# Patient Record
Sex: Female | Born: 1950
Health system: Southern US, Community
[De-identification: ages and names within clinical notes are randomized; demographics above are authoritative.]

## PROBLEM LIST (undated history)

## (undated) DIAGNOSIS — F419 Anxiety disorder, unspecified: Secondary | ICD-10-CM

## (undated) DIAGNOSIS — F32A Depression, unspecified: Secondary | ICD-10-CM

## (undated) DIAGNOSIS — R42 Dizziness and giddiness: Secondary | ICD-10-CM

## (undated) DIAGNOSIS — F329 Major depressive disorder, single episode, unspecified: Secondary | ICD-10-CM

## (undated) DIAGNOSIS — N809 Endometriosis, unspecified: Secondary | ICD-10-CM

## (undated) DIAGNOSIS — K635 Polyp of colon: Secondary | ICD-10-CM

## (undated) HISTORY — PX: POLYPECTOMY: SHX149

## (undated) HISTORY — DX: Polyp of colon: K63.5

## (undated) HISTORY — DX: Anxiety disorder, unspecified: F41.9

## (undated) HISTORY — PX: ABLATION ON ENDOMETRIOSIS: SHX5787

## (undated) HISTORY — DX: Endometriosis, unspecified: N80.9

## (undated) HISTORY — DX: Depression, unspecified: F32.A

## (undated) HISTORY — DX: Major depressive disorder, single episode, unspecified: F32.9

---
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
## 2000-07-23 ENCOUNTER — Encounter (INDEPENDENT_AMBULATORY_CARE_PROVIDER_SITE_OTHER): Payer: Self-pay | Admitting: *Deleted

## 2000-07-23 ENCOUNTER — Encounter: Payer: Self-pay | Admitting: *Deleted

## 2000-07-23 ENCOUNTER — Encounter: Admission: RE | Admit: 2000-07-23 | Discharge: 2000-07-23 | Payer: Self-pay | Admitting: *Deleted

## 2000-07-23 ENCOUNTER — Other Ambulatory Visit: Admission: RE | Admit: 2000-07-23 | Discharge: 2000-07-23 | Payer: Self-pay | Admitting: *Deleted

## 2001-07-28 ENCOUNTER — Ambulatory Visit (HOSPITAL_COMMUNITY): Admission: RE | Admit: 2001-07-28 | Discharge: 2001-07-28 | Payer: Self-pay | Admitting: Gastroenterology

## 2001-07-28 ENCOUNTER — Encounter (INDEPENDENT_AMBULATORY_CARE_PROVIDER_SITE_OTHER): Payer: Self-pay | Admitting: *Deleted

## 2004-08-01 ENCOUNTER — Ambulatory Visit: Payer: Self-pay | Admitting: Internal Medicine

## 2004-08-29 ENCOUNTER — Ambulatory Visit: Payer: Self-pay | Admitting: Internal Medicine

## 2004-09-18 ENCOUNTER — Other Ambulatory Visit: Admission: RE | Admit: 2004-09-18 | Discharge: 2004-09-18 | Payer: Self-pay | Admitting: Family Medicine

## 2004-09-18 ENCOUNTER — Ambulatory Visit: Payer: Self-pay | Admitting: Family Medicine

## 2005-08-09 ENCOUNTER — Ambulatory Visit: Payer: Self-pay | Admitting: Internal Medicine

## 2006-06-26 ENCOUNTER — Ambulatory Visit: Payer: Self-pay | Admitting: Internal Medicine

## 2006-06-26 DIAGNOSIS — F411 Generalized anxiety disorder: Secondary | ICD-10-CM | POA: Insufficient documentation

## 2006-06-26 DIAGNOSIS — E781 Pure hyperglyceridemia: Secondary | ICD-10-CM | POA: Insufficient documentation

## 2006-09-17 ENCOUNTER — Telehealth (INDEPENDENT_AMBULATORY_CARE_PROVIDER_SITE_OTHER): Payer: Self-pay | Admitting: *Deleted

## 2010-02-12 ENCOUNTER — Encounter: Payer: Self-pay | Admitting: Family Medicine

## 2010-06-09 NOTE — Procedures (Signed)
[place]. [facility]  Patient:    Robin Tucker, Robin Tucker Visit Number: [id number] MRN: [id number]          Service Type: END Location: ENDO Attending Physician:  Nelda Marseille Dictated by:   Petra Kuba, M.D. Proc. Date: 07/28/01 Admit Date:  07/28/2001 Discharge Date: 07/28/2001   CC:         Vanita Panda   Procedure Report  PROCEDURE:  Colonoscopy with polypectomy.  INDICATION:  Family history of colon cancer, patient due for colonic screening.  Consent was signed after risks, benefits, methods, and options were thoroughly discussed in the office.  MEDICATIONS:  Demerol 70, Versed 7.5.  PROCEDURE:  Rectal inspection was pertinent for external hemorrhoids, small. Digital examination was negative.  The pediatric video adjustable colonoscope was inserted, easily advanced around the colon to the cecum.  This did require some abdominal pressure, no position changes.  The cecum was identified by the appendiceal orifice and the ileocecal valve.  The prep was adequate.  There was some liquid stool that required washing and suctioning.  On slow withdrawal through the colon, in the proximal transverse, a 3-4 mm sessile polyp was seen and hot biopsied x 2.  The scope was slowly withdrawn.  In the distal descending, a tiny polyp was seen and hot biopsied x 1.  It was put into a separate container.  The scope was further withdrawn.  No other abnormalities were seen.  The scope was retroflexed back in the rectum, pertinent for some internal hemorrhoids.  The scope was straightened, readvanced a short ways up the left side of the colon, air was suctioned, and the scope was removed.  The patient tolerated the procedure well.  There were no obvious immediate complication.  ENDOSCOPIC DIAGNOSES: 1. Internal and external hemorrhoids. 2. Polyps: a tiny in the descending, a small in the proximal transverse, both    hot biopsied. 3. Otherwise within normal  limits to the cecum.  PLAN:  Await pathology to determine future colonic screening.  Happy to see back p.r.n., otherwise return care to Dr. Anner Crete for the customary health care management to include yearly rectals and guaiacs. Dictated by:   Petra Kuba, M.D. Attending Physician:  Nelda Marseille DD:  07/28/01 TD:  07/30/01 Job: [phone]

## 2011-12-03 ENCOUNTER — Other Ambulatory Visit: Payer: Self-pay | Admitting: *Deleted

## 2012-05-15 ENCOUNTER — Ambulatory Visit (INDEPENDENT_AMBULATORY_CARE_PROVIDER_SITE_OTHER): Payer: BC Managed Care – PPO | Admitting: Family Medicine

## 2012-05-15 ENCOUNTER — Encounter: Payer: Self-pay | Admitting: Family Medicine

## 2012-05-15 VITALS — BP 130/85 | HR 88 | Temp 98.0°F | Resp 22

## 2012-05-15 DIAGNOSIS — J019 Acute sinusitis, unspecified: Secondary | ICD-10-CM

## 2012-05-15 DIAGNOSIS — R059 Cough, unspecified: Secondary | ICD-10-CM

## 2012-05-15 DIAGNOSIS — J209 Acute bronchitis, unspecified: Secondary | ICD-10-CM

## 2012-05-15 DIAGNOSIS — R0602 Shortness of breath: Secondary | ICD-10-CM

## 2012-05-15 DIAGNOSIS — R05 Cough: Secondary | ICD-10-CM

## 2012-05-15 DIAGNOSIS — R079 Chest pain, unspecified: Secondary | ICD-10-CM

## 2012-05-15 MED ORDER — PROMETHAZINE-DM 6.25-15 MG/5ML PO SYRP: 5.0000 mL | ORAL_SOLUTION | Freq: Every evening | ORAL | Status: DC | PRN

## 2012-05-15 MED ORDER — SULFAMETHOXAZOLE-TRIMETHOPRIM 800-160 MG PO TABS: 1.0000 | ORAL_TABLET | Freq: Two times a day (BID) | ORAL | Status: DC

## 2012-05-15 MED ORDER — ALBUTEROL SULFATE HFA 108 (90 BASE) MCG/ACT IN AERS: 2.0000 | INHALATION_SPRAY | Freq: Four times a day (QID) | RESPIRATORY_TRACT | Status: DC | PRN

## 2012-05-15 NOTE — Progress Notes (Signed)
[facility]:  Office Visit  Chief Complaint:  Chief Complaint  Patient presents with  . Cough  . Chest Pain  . Shortness of Breath    HPI: Robin Tucker is a 62 y.o. female who complains of  chest pain and SOB with URI sxs, fever Tmax 100.2  x several days, worse since last Monday. Started in her sinuses. Has not tried anything except OTC meds. + facial pain, nasal congestion. She has had a dry cough. She has cough so much her chest hurts. She denies having HTN, DM, XOL.  Mom with history of MI at age 38. She denies smoking. She has not been to a doctor in 15 years so she is really unable to tell me if she has hyperlipidemia, diabetes or not.   History reviewed. No pertinent past medical history. History reviewed. No pertinent past surgical history. History   Social History  . Marital Status: Single    Spouse Name: N/A    Number of Children: N/A  . Years of Education: N/A   Social History Main Topics  . Smoking status: Never Smoker   . Smokeless tobacco: None  . Alcohol Use: No  . Drug Use: No  . Sexually Active: No   Other Topics Concern  . None   Social History Narrative  . None   Family History  Problem Relation Age of Onset  . Heart disease Mother    Allergies  Allergen Reactions  . Codeine    Prior to Admission medications   Not on File     ROS: The patient denies night sweats, unintentional weight loss, palpitations, nausea, vomiting, abdominal pain, dysuria, hematuria, melena, numbness, weakness, or tingling.   All other systems have been reviewed and were otherwise negative with the exception of those mentioned in the HPI and as above.    PHYSICAL EXAM: Filed Vitals:   05/15/12 1747  BP: 130/85  Pulse: 88  Temp: 99.8 F  Resp: 16  Spo2     96%  General: Alert, moderate  distress HEENT:  Normocephalic, atraumatic, oropharynx patent. No exudates, Tm nl. + sinus tenderness , erythematous throat.  Cardiovascular:  Regular rate  and rhythm, no rubs murmurs or gallops.  No Carotid bruits, radial pulse intact. No pedal edema.  Respiratory: Clear to auscultation bilaterally.  No wheezes, rales, or rhonchi.  No cyanosis, no use of accessory musculature. She is able to speak to me in full sentences GI: No organomegaly, abdomen is soft and non-tender, positive bowel sounds.  No masses. Skin: No rashes. Neurologic: Facial musculature symmetric. Psychiatric: Patient is appropriate throughout our interaction. Lymphatic: No cervical lymphadenopathy Musculoskeletal: Gait intact.   LABS: No results found for this or any previous visit.   EKG/XRAY:   Primary read interpreted by Dr. Conley Rolls at [facility].   ASSESSMENT/PLAN: Encounter Diagnoses  Name Primary?  . Chest pain   . SOB (shortness of breath)   . Acute bronchitis Yes  . Acute sinusitis   . Cough    Patient declined labs and xrays, states she has no money and her deductible is very high. I advise her that if anything worsens she needs to go to ER We discussed worse case scenarios, patient will take her chances. I advised her that if she has something cardiovascular or pulmonary that is not just bronchitis and sinusitis then we would have been able to pick that up on possibly CBC, CXR or EKG. She still declined any further testing.  I  will rx her Bactrim, Promethazine and Dextropmorphan and also Albuterol inh Work note for off from 4/22-4/25/2014 F/u in 48-72 hrs if no improvement or go to ER  For worsening sxs     Shyra Emile PHUONG, DO 05/16/2012 5:55 PM

## 2013-08-26 ENCOUNTER — Ambulatory Visit (INDEPENDENT_AMBULATORY_CARE_PROVIDER_SITE_OTHER): Payer: BC Managed Care – PPO | Admitting: Family Medicine

## 2013-08-26 DIAGNOSIS — R42 Dizziness and giddiness: Secondary | ICD-10-CM

## 2013-08-26 DIAGNOSIS — R112 Nausea with vomiting, unspecified: Secondary | ICD-10-CM

## 2013-08-26 DIAGNOSIS — E669 Obesity, unspecified: Secondary | ICD-10-CM

## 2013-08-26 LAB — BASIC METABOLIC PANEL
BUN: 12 mg/dL (ref 6–23)
CALCIUM: 9.3 mg/dL (ref 8.4–10.5)
CHLORIDE: 106 meq/L (ref 96–112)
CO2: 26 meq/L (ref 19–32)
CREATININE: 0.78 mg/dL (ref 0.50–1.10)
Glucose, Bld: 145 mg/dL — ABNORMAL HIGH (ref 70–99)
Potassium: 4.5 mEq/L (ref 3.5–5.3)
SODIUM: 141 meq/L (ref 135–145)

## 2013-08-26 LAB — POCT CBC
GRANULOCYTE PERCENT: 76.4 % (ref 37–80)
HEMATOCRIT: 41.6 % (ref 37.7–47.9)
HEMOGLOBIN: 13.4 g/dL (ref 12.2–16.2)
LYMPH, POC: 1.7 (ref 0.6–3.4)
MCH, POC: 32.2 pg — AB (ref 27–31.2)
MCHC: 32.2 g/dL (ref 31.8–35.4)
MCV: 99.9 fL — AB (ref 80–97)
MID (cbc): 0.1 (ref 0–0.9)
MPV: 6.8 fL (ref 0–99.8)
POC GRANULOCYTE: 5.7 (ref 2–6.9)
POC LYMPH PERCENT: 22.2 %L (ref 10–50)
POC MID %: 1.4 % (ref 0–12)
Platelet Count, POC: 248 10*3/uL (ref 142–424)
RBC: 4.17 M/uL (ref 4.04–5.48)
RDW, POC: 14.8 %
WBC: 7.5 10*3/uL (ref 4.6–10.2)

## 2013-08-26 LAB — GLUCOSE, POCT (MANUAL RESULT ENTRY): POC GLUCOSE: 143 mg/dL — AB (ref 70–99)

## 2013-08-26 MED ORDER — MECLIZINE HCL 25 MG PO TABS: ORAL_TABLET | ORAL | Status: DC

## 2013-08-26 MED ORDER — ONDANSETRON HCL 4 MG/2ML IJ SOLN
4.0000 mg | Freq: Once | INTRAMUSCULAR | Status: AC
  Administered 2013-08-26: 4 mg via INTRAMUSCULAR

## 2013-08-26 MED ORDER — PROMETHAZINE HCL 25 MG/ML IJ SOLN
25.0000 mg | Freq: Once | INTRAMUSCULAR | Status: AC
  Administered 2013-08-26: 25 mg via INTRAMUSCULAR

## 2013-08-26 MED ORDER — ONDANSETRON 4 MG PO TBDP: ORAL_TABLET | ORAL | Status: DC

## 2013-08-26 MED ORDER — DIAZEPAM 2 MG PO TABS: ORAL_TABLET | ORAL | Status: DC

## 2013-08-26 NOTE — Progress Notes (Signed)
Subjective: 63 year old lady who got up about 6:00 this morning. When she sat up she developed profound dizziness. This started vomiting. The dizziness and vomiting have persisted. She came on in here. She felt fine yesterday she did eat a hot dog last night at an event she had no head injuries. She did not drink any alcohol last night. She is not on any regular medications. She has not had dizziness episodes like this since college. She feels like she had a bad drinking episode. She developed some headache only after the repeated vomiting.  Objective: Photophobic lady. Obese. Persistent vomiting. Eyes PERRLA. Chest clear. Heart regular without murmurs. Abdomen soft nontender. Laying on her side vomiting. Hard to do much additional exam.  Assessment: Vertigo Vomiting Headache Obesity   Plan: Zofran 4 mg IM Phenergan 25 mg IM Got some relief Able to take a little water by mouth. Patient declined IV. Xanax 0.25 mg given for   The patient was monitored in the office for over 2 hours from 8 until 10:30 AM. The symptoms gradually improved with Zofran, Phenergan, and alprazolam. She is still very dizzy but the vomiting has subsided. She is now able to sit up and open her eyes. Uses all extremities. EOMs are intact. There was transiently noted a little nystagmus when she gazes to the right, and I could not get it to repeat. She has been able to take a couple bottles of water. The patient does not wish to go into the emergency room for further evaluation. She will go home on medications. She is instructed to go to the ER if worse. During the course of her stay here in the office I checked her numerous times and used over one hour of physician time  Results for orders placed in visit on 08/26/13  POCT CBC      Result Value Ref Range   WBC 7.5  4.6 - 10.2 K/uL   Lymph, poc 1.7  0.6 - 3.4   POC LYMPH PERCENT 22.2  10 - 50 %L   MID (cbc) 0.1  0 - 0.9   POC MID % 1.4  0 - 12 %M   POC Granulocyte 5.7   2 - 6.9   Granulocyte percent 76.4  37 - 80 %G   RBC 4.17  4.04 - 5.48 M/uL   Hemoglobin 13.4  12.2 - 16.2 g/dL   HCT, POC 41.6  37.7 - 47.9 %   MCV 99.9 (*) 80 - 97 fL   MCH, POC 32.2 (*) 27 - 31.2 pg   MCHC 32.2  31.8 - 35.4 g/dL   RDW, POC 14.8     Platelet Count, POC 248  142 - 424 K/uL   MPV 6.8  0 - 99.8 fL  GLUCOSE, POCT (MANUAL RESULT ENTRY)      Result Value Ref Range   POC Glucose 143 (*) 70 - 99 mg/dl

## 2013-08-26 NOTE — Patient Instructions (Signed)
Take Valium 2 mg (diazepam) every 6 hours if needed for dizziness  Take Antivert (meclizine) 25 mg one or 2 pills every 6 or 8 hours as needed for dizziness or nausea  Take Zofran (ondansetron) 4 mg dissolved in mouth every 6 or 8 hours as needed for nausea and vomiting  If worse go to the emergency room. If symptoms persist return to the office.

## 2013-09-01 ENCOUNTER — Other Ambulatory Visit: Payer: Self-pay | Admitting: Family Medicine

## 2013-09-02 NOTE — Telephone Encounter (Signed)
Tell patient if still having symptoms she needs to come in to get rechecked.  I am not maintaining her on valium (diazepam).

## 2013-12-31 ENCOUNTER — Emergency Department (HOSPITAL_COMMUNITY)
Admission: EM | Admit: 2013-12-31 | Discharge: 2013-12-31 | Disposition: A | Discharge status: Home / Self Care | Payer: BC Managed Care – PPO | Attending: Emergency Medicine | Admitting: Emergency Medicine

## 2013-12-31 ENCOUNTER — Encounter (HOSPITAL_COMMUNITY): Payer: Self-pay | Admitting: Emergency Medicine

## 2013-12-31 DIAGNOSIS — R42 Dizziness and giddiness: Secondary | ICD-10-CM | POA: Diagnosis present

## 2013-12-31 DIAGNOSIS — Z79899 Other long term (current) drug therapy: Secondary | ICD-10-CM | POA: Diagnosis not present

## 2013-12-31 DIAGNOSIS — R112 Nausea with vomiting, unspecified: Secondary | ICD-10-CM

## 2013-12-31 DIAGNOSIS — H8110 Benign paroxysmal vertigo, unspecified ear: Secondary | ICD-10-CM | POA: Insufficient documentation

## 2013-12-31 HISTORY — DX: Dizziness and giddiness: R42

## 2013-12-31 LAB — URINALYSIS, ROUTINE W REFLEX MICROSCOPIC
BILIRUBIN URINE: NEGATIVE
Glucose, UA: NEGATIVE mg/dL
HGB URINE DIPSTICK: NEGATIVE
Ketones, ur: 15 mg/dL — AB
Leukocytes, UA: NEGATIVE
Nitrite: NEGATIVE
PROTEIN: NEGATIVE mg/dL
Specific Gravity, Urine: 1.019 (ref 1.005–1.030)
UROBILINOGEN UA: 0.2 mg/dL (ref 0.0–1.0)
pH: 6 (ref 5.0–8.0)

## 2013-12-31 MED ORDER — PROMETHAZINE HCL 25 MG/ML IJ SOLN
25.0000 mg | Freq: Once | INTRAMUSCULAR | Status: AC
  Administered 2013-12-31: 25 mg via INTRAMUSCULAR
  Filled 2013-12-31: qty 1

## 2013-12-31 MED ORDER — DIAZEPAM 5 MG/ML IJ SOLN
2.5000 mg | Freq: Once | INTRAMUSCULAR | Status: AC
  Administered 2013-12-31: 2.5 mg via INTRAMUSCULAR
  Filled 2013-12-31: qty 2

## 2013-12-31 MED ORDER — DIAZEPAM 2 MG PO TABS: ORAL_TABLET | ORAL | Status: DC

## 2013-12-31 MED ORDER — PROMETHAZINE HCL 25 MG RE SUPP: 25.0000 mg | Freq: Four times a day (QID) | RECTAL | Status: DC | PRN

## 2013-12-31 NOTE — ED Notes (Signed)
Pt c/o dizziness and emesis onset today at 0200.

## 2013-12-31 NOTE — Discharge Instructions (Signed)
Return to the ED with any concerns including vomiting and not able to keep down liquids, weakness of arms or legs, changes in vision or speech, decreased level of alertness/lethargy, or any other alarming symptoms

## 2013-12-31 NOTE — ED Provider Notes (Signed)
CSN: [id number]     Arrival date & time 12/31/13  1356 History   First MD Initiated Contact with Patient 12/31/13 1509     No chief complaint on file.    (Consider location/radiation/quality/duration/timing/severity/associated sxs/prior Treatment) HPI  Pt with hx of vertigo, presents with c/o dizziness- sensation of room spinning.  She states symptoms began this morning when she woke up.  She has had associated nausea and vomiting.  No changes in speech, no weakness of arms or legs.  She has had similar symptoms in the past and was treated with meclizine, valium.  She tried taking meclizine which has not helped her symptoms today.  There are no other associated systemic symptoms, there are no other alleviating or modifying factors.   Past Medical History  Diagnosis Date  . Vertigo    No past surgical history on file. Family History  Problem Relation Age of Onset  . Heart disease Mother    History  Substance Use Topics  . Smoking status: Never Smoker   . Smokeless tobacco: Not on file  . Alcohol Use: No   OB History    No data available     Review of Systems  ROS reviewed and all otherwise negative except for mentioned in HPI    Allergies  Codeine and Naproxen  Home Medications   Prior to Admission medications   Medication Sig Start Date End Date Taking? Authorizing Provider  acetaminophen (TYLENOL) 500 MG tablet Take 1,000 mg by mouth every 6 (six) hours as needed for moderate pain (pain).   Yes Historical Provider, MD  Cholecalciferol (VITAMIN D3) 2000 UNITS capsule Take 2,000 Units by mouth daily.   Yes Historical Provider, MD  meclizine (ANTIVERT) 25 MG tablet Take one or 2 tablets every 6 or 8 hours as needed for dizziness 08/26/13  Yes Posey Boyer, MD  Methylsulfonylmethane (MSM) 1000 MG TABS Take 1 tablet by mouth 3 (three) times daily.   Yes Historical Provider, MD  Misc Natural Products (LUNG TONIC) CAPS Take 1 capsule by mouth every 14 (fourteen) days.   Yes  Historical Provider, MD  Multiple Vitamins-Minerals (MULTIVITAMIN & MINERAL PO) Take 1 tablet by mouth daily.   Yes Historical Provider, MD  ondansetron (ZOFRAN ODT) 4 MG disintegrating tablet Take one every 6 hours as needed for nausea or vomiting. May be dissolved in mouth. 08/26/13  Yes Posey Boyer, MD  albuterol (PROVENTIL HFA;VENTOLIN HFA) 108 (90 BASE) MCG/ACT inhaler Inhale 2 puffs into the lungs every 6 (six) hours as needed for wheezing. Patient not taking: Reported on 12/31/2013 05/15/12   Thao P Le, DO  diazepam (VALIUM) 2 MG tablet Take one every 6 hours as needed for dizziness 12/31/13   Threasa Beards, MD  promethazine (PHENERGAN) 25 MG suppository Place 1 suppository (25 mg total) rectally every 6 (six) hours as needed for nausea or vomiting. 12/31/13   Threasa Beards, MD  promethazine-dextromethorphan (PROMETHAZINE-DM) 6.25-15 MG/5ML syrup Take 5 mLs by mouth at bedtime and may repeat dose one time if needed. Patient not taking: Reported on 12/31/2013 05/15/12   Thao P Le, DO  sulfamethoxazole-trimethoprim (BACTRIM DS,SEPTRA DS) 800-160 MG per tablet Take 1 tablet by mouth 2 (two) times daily. Patient not taking: Reported on 12/31/2013 05/15/12   Thao P Le, DO   BP 131/62 mmHg  Pulse 85  Temp(Src) 97.9 F (36.6 C) (Oral)  Resp 18  SpO2 96%  Vitals reviewed Physical Exam  Physical Examination: General appearance - alert, uncomfortable  appearing, and in no distress Mental status - alert, oriented to person, place, and time Eyes - pupils equal and reactive, extraocular eye movements intact, horizontal nystagmus on lateral gaze Ears- TMS normal bilaterally Mouth - mucous membranes moist, pharynx normal without lesions Chest - clear to auscultation, no wheezes, rales or rhonchi, symmetric air entry Heart - normal rate, regular rhythm, normal S1, S2, no murmurs, rubs, clicks or gallops Neurological - alert, oriented x 3, normal speech, cranial nerves tested and intact, horizontal  nystagmus, strength intact in extremities x 4, sensastion intact Extremities - peripheral pulses normal, no pedal edema, no clubbing or cyanosis Skin - normal coloration and turgor, no rashes  ED Course  Procedures (including critical care time)  5:40 PM on recheck patient is feeling much improved, tolerating po fluids.  Remains somewhat dizzy but is able to open eyes and states she feels improved.   Labs Review Labs Reviewed  URINALYSIS, ROUTINE W REFLEX MICROSCOPIC - Abnormal; Notable for the following:    APPearance HAZY (*)    Ketones, ur 15 (*)    All other components within normal limits    Imaging Review No results found.   EKG Interpretation None      MDM   Final diagnoses:  BPPV (benign paroxysmal positional vertigo), unspecified laterality  Nausea and vomiting, vomiting of unspecified type    Pt presenting with c/o vertigo and nausea/vomiting.  She has hx of similar symptoms in the past.  Pt has many questions about vertigo and different medications- I have answered all her questions to the best of my ability.  She declines getting meclizine in the ED- states the hospital charge will be too high.  Requests IM phenergan.  On recheck after meds she is feeling much improved, she states she is ready for discharge, again I have attempted to answer mulitple questions.  Discussed epley maneuvers.  Doubt CVA - symptoms most c/w peripheral vertigo.  Advised close f/u with PMD.     Threasa Beards, MD 12/31/13 2001

## 2014-01-02 ENCOUNTER — Encounter (HOSPITAL_COMMUNITY): Payer: Self-pay | Admitting: Emergency Medicine

## 2014-01-02 ENCOUNTER — Ambulatory Visit (INDEPENDENT_AMBULATORY_CARE_PROVIDER_SITE_OTHER): Payer: BC Managed Care – PPO | Admitting: Internal Medicine

## 2014-01-02 ENCOUNTER — Emergency Department (HOSPITAL_COMMUNITY): Payer: BC Managed Care – PPO

## 2014-01-02 ENCOUNTER — Inpatient Hospital Stay (HOSPITAL_COMMUNITY)
Admission: EM | Admit: 2014-01-02 | Discharge: 2014-01-04 | DRG: 149 | Disposition: A | Discharge status: Home / Self Care | Payer: BC Managed Care – PPO | Attending: Internal Medicine | Admitting: Internal Medicine

## 2014-01-02 VITALS — BP 132/84 | HR 88 | Temp 97.7°F | Resp 18

## 2014-01-02 DIAGNOSIS — H8110 Benign paroxysmal vertigo, unspecified ear: Principal | ICD-10-CM | POA: Diagnosis present

## 2014-01-02 DIAGNOSIS — R42 Dizziness and giddiness: Secondary | ICD-10-CM

## 2014-01-02 DIAGNOSIS — R112 Nausea with vomiting, unspecified: Secondary | ICD-10-CM | POA: Diagnosis present

## 2014-01-02 DIAGNOSIS — H8113 Benign paroxysmal vertigo, bilateral: Secondary | ICD-10-CM

## 2014-01-02 DIAGNOSIS — Z79899 Other long term (current) drug therapy: Secondary | ICD-10-CM

## 2014-01-02 DIAGNOSIS — Z6841 Body Mass Index (BMI) 40.0 and over, adult: Secondary | ICD-10-CM

## 2014-01-02 LAB — COMPREHENSIVE METABOLIC PANEL
ALT: 56 U/L — AB (ref 0–35)
ANION GAP: 13 (ref 5–15)
AST: 39 U/L — AB (ref 0–37)
Albumin: 3.7 g/dL (ref 3.5–5.2)
Alkaline Phosphatase: 60 U/L (ref 39–117)
BILIRUBIN TOTAL: 0.8 mg/dL (ref 0.3–1.2)
BUN: 13 mg/dL (ref 6–23)
CHLORIDE: 107 meq/L (ref 96–112)
CO2: 26 mEq/L (ref 19–32)
Calcium: 9.1 mg/dL (ref 8.4–10.5)
Creatinine, Ser: 0.88 mg/dL (ref 0.50–1.10)
GFR calc Af Amer: 81 mL/min — ABNORMAL LOW (ref >90)
GFR calc non Af Amer: 69 mL/min — ABNORMAL LOW (ref >90)
Glucose, Bld: 88 mg/dL (ref 70–99)
Potassium: 3.9 mEq/L (ref 3.7–5.3)
Sodium: 146 mEq/L (ref 137–147)
Total Protein: 6.8 g/dL (ref 6.0–8.3)

## 2014-01-02 LAB — COMPLETE METABOLIC PANEL WITH GFR
ALK PHOS: 58 U/L (ref 39–117)
ALT: 55 U/L — AB (ref 0–35)
AST: 40 U/L — ABNORMAL HIGH (ref 0–37)
Albumin: 4.3 g/dL (ref 3.5–5.2)
BILIRUBIN TOTAL: 0.9 mg/dL (ref 0.2–1.2)
BUN: 14 mg/dL (ref 6–23)
CO2: 25 mEq/L (ref 19–32)
Calcium: 9.5 mg/dL (ref 8.4–10.5)
Chloride: 104 mEq/L (ref 96–112)
Creat: 0.94 mg/dL (ref 0.50–1.10)
GFR, EST NON AFRICAN AMERICAN: 66 mL/min
GFR, Est African American: 76 mL/min
Glucose, Bld: 112 mg/dL — ABNORMAL HIGH (ref 70–99)
Potassium: 4.1 mEq/L (ref 3.5–5.3)
SODIUM: 141 meq/L (ref 135–145)
Total Protein: 7 g/dL (ref 6.0–8.3)

## 2014-01-02 LAB — POCT CBC
Granulocyte percent: 58.9 %G (ref 37–80)
HCT, POC: 43 % (ref 37.7–47.9)
Hemoglobin: 14.1 g/dL (ref 12.2–16.2)
Lymph, poc: 2.9 (ref 0.6–3.4)
MCH, POC: 32.5 pg — AB (ref 27–31.2)
MCHC: 32.9 g/dL (ref 31.8–35.4)
MCV: 98.5 fL — AB (ref 80–97)
MID (CBC): 0.2 (ref 0–0.9)
MPV: 6.8 fL (ref 0–99.8)
POC GRANULOCYTE: 4.5 (ref 2–6.9)
POC LYMPH PERCENT: 38.1 %L (ref 10–50)
POC MID %: 3 %M (ref 0–12)
Platelet Count, POC: 269 10*3/uL (ref 142–424)
RBC: 4.36 M/uL (ref 4.04–5.48)
RDW, POC: 13.4 %
WBC: 7.7 10*3/uL (ref 4.6–10.2)

## 2014-01-02 LAB — LIPID PANEL
Cholesterol: 222 mg/dL — ABNORMAL HIGH (ref 0–200)
HDL: 49 mg/dL (ref >39)
LDL CALC: 140 mg/dL — AB (ref 0–99)
Total CHOL/HDL Ratio: 4.5 Ratio
Triglycerides: 166 mg/dL — ABNORMAL HIGH (ref <150)
VLDL: 33 mg/dL (ref 0–40)

## 2014-01-02 LAB — POCT URINALYSIS DIPSTICK
Blood, UA: NEGATIVE
Glucose, UA: NEGATIVE
Nitrite, UA: NEGATIVE
PROTEIN UA: 100
Spec Grav, UA: >=1.03
UROBILINOGEN UA: 1
pH, UA: 5.5

## 2014-01-02 LAB — POCT UA - MICROSCOPIC ONLY
CASTS, UR, LPF, POC: NEGATIVE
Crystals, Ur, HPF, POC: NEGATIVE
Yeast, UA: NEGATIVE

## 2014-01-02 LAB — POCT GLYCOSYLATED HEMOGLOBIN (HGB A1C): Hemoglobin A1C: 6.2

## 2014-01-02 LAB — GLUCOSE, POCT (MANUAL RESULT ENTRY): POC GLUCOSE: 100 mg/dL — AB (ref 70–99)

## 2014-01-02 MED ORDER — MECLIZINE HCL 25 MG PO TABS
25.0000 mg | ORAL_TABLET | Freq: Once | ORAL | Status: AC
  Administered 2014-01-02: 25 mg via ORAL
  Filled 2014-01-02: qty 1

## 2014-01-02 MED ORDER — ALUM & MAG HYDROXIDE-SIMETH 200-200-20 MG/5ML PO SUSP: 30.0000 mL | Freq: Four times a day (QID) | ORAL | Status: DC | PRN

## 2014-01-02 MED ORDER — GADOBENATE DIMEGLUMINE 529 MG/ML IV SOLN
20.0000 mL | Freq: Once | INTRAVENOUS | Status: AC | PRN
  Administered 2014-01-02: 20 mL via INTRAVENOUS

## 2014-01-02 MED ORDER — LORAZEPAM 2 MG/ML IJ SOLN
1.0000 mg | Freq: Once | INTRAMUSCULAR | Status: AC
  Administered 2014-01-02: 1 mg via INTRAVENOUS
  Filled 2014-01-02: qty 1

## 2014-01-02 MED ORDER — ENOXAPARIN SODIUM 40 MG/0.4ML ~~LOC~~ SOLN
40.0000 mg | Freq: Every day | SUBCUTANEOUS | Status: DC
  Filled 2014-01-02 (×3): qty 0.4

## 2014-01-02 MED ORDER — MECLIZINE HCL 25 MG PO TABS
50.0000 mg | ORAL_TABLET | Freq: Three times a day (TID) | ORAL | Status: DC | PRN
  Administered 2014-01-03 – 2014-01-04 (×3): 50 mg via ORAL
  Filled 2014-01-02 (×4): qty 2

## 2014-01-02 MED ORDER — ACETAMINOPHEN 650 MG RE SUPP: 650.0000 mg | Freq: Four times a day (QID) | RECTAL | Status: DC | PRN

## 2014-01-02 MED ORDER — ONDANSETRON HCL 4 MG PO TABS: 4.0000 mg | ORAL_TABLET | Freq: Four times a day (QID) | ORAL | Status: DC | PRN

## 2014-01-02 MED ORDER — ONDANSETRON HCL 4 MG/2ML IJ SOLN
4.0000 mg | Freq: Four times a day (QID) | INTRAMUSCULAR | Status: DC | PRN
  Administered 2014-01-03 (×2): 4 mg via INTRAVENOUS
  Filled 2014-01-02 (×2): qty 2

## 2014-01-02 MED ORDER — ACETAMINOPHEN 325 MG PO TABS
650.0000 mg | ORAL_TABLET | Freq: Four times a day (QID) | ORAL | Status: DC | PRN
  Administered 2014-01-03: 650 mg via ORAL
  Filled 2014-01-02: qty 2

## 2014-01-02 MED ORDER — HYDROMORPHONE HCL 1 MG/ML IJ SOLN
0.5000 mg | INTRAMUSCULAR | Status: DC | PRN
  Administered 2014-01-02 – 2014-01-03 (×3): 1 mg via INTRAVENOUS
  Filled 2014-01-02 (×3): qty 1

## 2014-01-02 MED ORDER — MECLIZINE HCL 25 MG PO TABS: ORAL_TABLET | ORAL | Status: DC

## 2014-01-02 MED ORDER — PROMETHAZINE HCL 25 MG/ML IJ SOLN
25.0000 mg | INTRAMUSCULAR | Status: DC | PRN
Start: 2014-01-02 — End: 2014-01-04
  Administered 2014-01-03 – 2014-01-04 (×2): 25 mg via INTRAVENOUS
  Filled 2014-01-02 (×3): qty 1

## 2014-01-02 MED ORDER — SODIUM CHLORIDE 0.9 % IV BOLUS (SEPSIS)
1000.0000 mL | Freq: Once | INTRAVENOUS | Status: AC
  Administered 2014-01-02: 1000 mL via INTRAVENOUS

## 2014-01-02 MED ORDER — VITAMIN D 1000 UNITS PO TABS
2000.0000 [IU] | ORAL_TABLET | Freq: Every day | ORAL | Status: DC
  Filled 2014-01-02 (×2): qty 2

## 2014-01-02 MED ORDER — SODIUM CHLORIDE 0.9 % IV SOLN
INTRAVENOUS | Status: DC
  Administered 2014-01-02 – 2014-01-03 (×2): via INTRAVENOUS
  Administered 2014-01-03: 75 mL/h via INTRAVENOUS

## 2014-01-02 MED ORDER — PROMETHAZINE HCL 25 MG/ML IJ SOLN
25.0000 mg | Freq: Once | INTRAMUSCULAR | Status: AC
  Administered 2014-01-02: 25 mg via INTRAMUSCULAR

## 2014-01-02 MED ORDER — DIAZEPAM 2 MG PO TABS: ORAL_TABLET | ORAL | Status: DC

## 2014-01-02 MED ORDER — DIAZEPAM 5 MG/ML IJ SOLN
2.5000 mg | Freq: Four times a day (QID) | INTRAMUSCULAR | Status: DC | PRN
  Administered 2014-01-02: 2.5 mg via INTRAVENOUS
  Filled 2014-01-02: qty 2

## 2014-01-02 NOTE — ED Notes (Signed)
MD at bedside. 

## 2014-01-02 NOTE — ED Notes (Addendum)
This nurse was at bedside to d/c pt. Family at bedside and asking multiple questions concerning pt's not being admitted and can she see the MD prior to d/c. Review pt's chart and labs with family. Will ask charge nurse to speak with pt and family with concerns. MD aware of family wanting to speak with him prior to d/c.

## 2014-01-02 NOTE — ED Notes (Addendum)
Dr Tat at pt bedside. This RN is chaperoning exam at pt request. Pt is at this time arguing and making condescending remarks to hospitalist. Pt also sts that she does not need all the testing that is recommended and sts "I can get scanned other placed cheaper." Pt has been uncooperative, yells and been disrespectful to all staff since her admission. Pt sts that she only went to UC to get her valium refilled so she can sleep. Pt sts" I demand that you send me home with enough medications to help me. You've got to admit this is a silly game to play." Pt continues to be condescending and rude to Dr. Carles Collet. Pt is refusing admit to hospital

## 2014-01-02 NOTE — ED Notes (Signed)
Pt to MRI

## 2014-01-02 NOTE — ED Notes (Signed)
Dr. Jenkins at bedside. 

## 2014-01-02 NOTE — Progress Notes (Signed)
Subjective:    Patient ID: Robin Tucker, female    DOB: 1950/05/13, 63 y.o.   MRN: [id number]  HPI 63 yo female with no doctor, no checkups in over 15 years started with sudden onset vertigo and vomiting 2 days ago. Went to ER , dx with common vertigo, given antiemetics and sent home to f/up some where so came here. EKG was normal, urine showed ketones only, cbc normal. Has assoc HA, hx of rare migraines, no focal weakness, numbness, speech change, disorientation or syncope. No incontinence. Unable to walk due to vertigo. Positive head tilt, no nystagmus seen. Unable to eat and drink on her own.  No at home care givers    Review of Systems     Objective:   Physical Exam  Constitutional: She is oriented to person, place, and time. She appears well-nourished. She appears distressed.  HENT:  Head: Normocephalic.  Eyes: Conjunctivae and EOM are normal. Pupils are equal, round, and reactive to light. No scleral icterus.  Neck: Normal range of motion. Neck supple.  Cardiovascular: Regular rhythm, S1 normal, S2 normal and normal heart sounds.  Tachycardia present.   Pulmonary/Chest: Effort normal and breath sounds normal. No respiratory distress. She exhibits no tenderness.  Abdominal: Soft. There is no tenderness.  Neurological: She is alert and oriented to person, place, and time. She has normal strength and normal reflexes. No cranial nerve deficit or sensory deficit. Coordination and gait abnormal. She displays no Babinski's sign on the right side. She displays no Babinski's sign on the left side.  Head tilt too hard, any head movement triggers severe vertigo  Psychiatric: She has a normal mood and affect. Her speech is normal and behavior is normal. Judgment and thought content normal. Cognition and memory are normal.   Phenergan IM Results for orders placed or performed in visit on 01/02/14  POCT glucose (manual entry)  Result Value Ref Range   POC Glucose 100 (A) 70 - 99  mg/dl  POCT glycosylated hemoglobin (Hb A1C)  Result Value Ref Range   Hemoglobin A1C 6.2   POCT UA - Microscopic Only  Result Value Ref Range   WBC, Ur, HPF, POC 8-12    RBC, urine, microscopic 1-3    Bacteria, U Microscopic 2+    Mucus, UA small    Epithelial cells, urine per micros TNTC    Crystals, Ur, HPF, POC neg    Casts, Ur, LPF, POC neg    Yeast, UA neg   POCT urinalysis dipstick  Result Value Ref Range   Color, UA amber    Clarity, UA cloudy    Glucose, UA neg    Bilirubin, UA mod    Ketones, UA trace    Spec Grav, UA >=1.030    Blood, UA neg    pH, UA 5.5    Protein, UA 100    Urobilinogen, UA 1.0    Nitrite, UA neg    Leukocytes, UA Trace   POCT CBC  Result Value Ref Range   WBC 7.7 4.6 - 10.2 K/uL   Lymph, poc 2.9 0.6 - 3.4   POC LYMPH PERCENT 38.1 10 - 50 %L   MID (cbc) 0.2 0 - 0.9   POC MID % 3.0 0 - 12 %M   POC Granulocyte 4.5 2 - 6.9   Granulocyte percent 58.9 37 - 80 %G   RBC 4.36 4.04 - 5.48 M/uL   Hemoglobin 14.1 12.2 - 16.2 g/dL   HCT, POC 43.0 37.7 -  47.9 %   MCV 98.5 (A) 80 - 97 fL   MCH, POC 32.5 (A) 27 - 31.2 pg   MCHC 32.9 31.8 - 35.4 g/dL   RDW, POC 13.4 %   Platelet Count, POC 269 142 - 424 K/uL   MPV 6.8 0 - 99.8 fL   Dehydration evident Glucose intolerance present Severe vertigo/vomiting  Phenergan 25mg  im given      Assessment & Plan:  Dehydration/Vertigo persistent vomiting Needs brain scan and admission Start IV fluids/Send to ER with EMTs

## 2014-01-02 NOTE — ED Provider Notes (Addendum)
Patient signed out to me by Dr. Sabra Heck and MRI results reviewed and without signs of stroke. Will be admitted by hospitalist  6:55 PM Patient does not want to be admitted to the hospital at this time. She is very upset with her interaction with the hospitalist. I will give her a dose of Ativan here. And I will discharge her with a prescription for Antivert and Valium.  8:54 PM After a long discussion with the family she is agreeable to be admitted at this time.  Leota Jacobsen, MD 01/02/14 1804  Leota Jacobsen, MD 01/02/14 1856  Leota Jacobsen, MD 01/02/14 [phone]

## 2014-01-02 NOTE — ED Notes (Addendum)
Charge RN spoke to patient..the patient is concerned that her questions weren't being answered and the reason she was sent to ER for dehydration, vomiting and vertigo, and none of this was being addressed, then when admitting Dr came in and was telling her the plan, she states that she was asking questions and he wasn't answering the questions but assuming she was refusing. The patient says that she wasn't refusing but being admitted is a big deal and wanted to know why. Dr Zenia Resides aware of the situation and will go in ans speak to the patient and her family.

## 2014-01-02 NOTE — ED Notes (Signed)
Phlebotomy at bedside.

## 2014-01-02 NOTE — ED Notes (Signed)
Pt from [facility] via EMS-pt sts that she was seen at this facility 2 days ago for same c/o. Pt went to [facility] this am c/o same again. Pt has had no relief from dizziness/nausea except when sleeping. Pt is unable to move head from side to side. Pt is A&O and in NAD

## 2014-01-02 NOTE — Patient Instructions (Addendum)
Vertigo Vertigo means you feel like you or your surroundings are moving when they are not. Vertigo can be dangerous if it occurs when you are at work, driving, or performing difficult activities.  CAUSES  Vertigo occurs when there is a conflict of signals sent to your brain from the visual and sensory systems in your body. There are many different causes of vertigo, including:  Infections, especially in the inner ear.  A bad reaction to a drug or misuse of alcohol and medicines.  Withdrawal from drugs or alcohol.  Rapidly changing positions, such as lying down or rolling over in bed.  A migraine headache.  Decreased blood flow to the brain.  Increased pressure in the brain from a head injury, infection, tumor, or bleeding. SYMPTOMS  You may feel as though the world is spinning around or you are falling to the ground. Because your balance is upset, vertigo can cause nausea and vomiting. You may have involuntary eye movements (nystagmus). DIAGNOSIS  Vertigo is usually diagnosed by physical exam. If the cause of your vertigo is unknown, your caregiver may perform imaging tests, such as an MRI scan (magnetic resonance imaging). TREATMENT  Most cases of vertigo resolve on their own, without treatment. Depending on the cause, your caregiver may prescribe certain medicines. If your vertigo is related to body position issues, your caregiver may recommend movements or procedures to correct the problem. In rare cases, if your vertigo is caused by certain inner ear problems, you may need surgery. HOME CARE INSTRUCTIONS   Follow your caregiver's instructions.  Avoid driving.  Avoid operating heavy machinery.  Avoid performing any tasks that would be dangerous to you or others during a vertigo episode.  Tell your caregiver if you notice that certain medicines seem to be causing your vertigo. Some of the medicines used to treat vertigo episodes can actually make them worse in some people. SEEK  IMMEDIATE MEDICAL CARE IF:   Your medicines do not relieve your vertigo or are making it worse.  You develop problems with talking, walking, weakness, or using your arms, hands, or legs.  You develop severe headaches.  Your nausea or vomiting continues or gets worse.  You develop visual changes.  A family member notices behavioral changes.  Your condition gets worse. MAKE SURE YOU:  Understand these instructions.  Will watch your condition.  Will get help right away if you are not doing well or get worse. Document Released: 10/18/2004 Document Revised: 04/02/2011 Document Reviewed: 07/27/2010 [facility] Patient Information 2015 [place], [place]. This information is not intended to replace advice given to you by your health care provider. Make sure you discuss any questions you have with your health care provider. Nausea and Vomiting Nausea is a sick feeling that often comes before throwing up (vomiting). Vomiting is a reflex where stomach contents come out of your mouth. Vomiting can cause severe loss of body fluids (dehydration). Children and elderly adults can become dehydrated quickly, especially if they also have diarrhea. Nausea and vomiting are symptoms of a condition or disease. It is important to find the cause of your symptoms. CAUSES   Direct irritation of the stomach lining. This irritation can result from increased acid production (gastroesophageal reflux disease), infection, food poisoning, taking certain medicines (such as nonsteroidal anti-inflammatory drugs), alcohol use, or tobacco use.  Signals from the brain.These signals could be caused by a headache, heat exposure, an inner ear disturbance, increased pressure in the brain from injury, infection, a tumor, or a concussion, pain, emotional  stimulus, or metabolic problems.  An obstruction in the gastrointestinal tract (bowel obstruction).  Illnesses such as diabetes, hepatitis, gallbladder problems, appendicitis,  kidney problems, cancer, sepsis, atypical symptoms of a heart attack, or eating disorders.  Medical treatments such as chemotherapy and radiation.  Receiving medicine that makes you sleep (general anesthetic) during surgery. DIAGNOSIS Your caregiver may ask for tests to be done if the problems do not improve after a few days. Tests may also be done if symptoms are severe or if the reason for the nausea and vomiting is not clear. Tests may include:  Urine tests.  Blood tests.  Stool tests.  Cultures (to look for evidence of infection).  X-rays or other imaging studies. Test results can help your caregiver make decisions about treatment or the need for additional tests. TREATMENT You need to stay well hydrated. Drink frequently but in small amounts.You may wish to drink water, sports drinks, clear broth, or eat frozen ice pops or gelatin dessert to help stay hydrated.When you eat, eating slowly may help prevent nausea.There are also some antinausea medicines that may help prevent nausea. HOME CARE INSTRUCTIONS   Take all medicine as directed by your caregiver.  If you do not have an appetite, do not force yourself to eat. However, you must continue to drink fluids.  If you have an appetite, eat a normal diet unless your caregiver tells you differently.  Eat a variety of complex carbohydrates (rice, wheat, potatoes, bread), lean meats, yogurt, fruits, and vegetables.  Avoid high-fat foods because they are more difficult to digest.  Drink enough water and fluids to keep your urine clear or pale yellow.  If you are dehydrated, ask your caregiver for specific rehydration instructions. Signs of dehydration may include:  Severe thirst.  Dry lips and mouth.  Dizziness.  Dark urine.  Decreasing urine frequency and amount.  Confusion.  Rapid breathing or pulse. SEEK IMMEDIATE MEDICAL CARE IF:   You have blood or brown flecks (like coffee grounds) in your vomit.  You have  black or bloody stools.  You have a severe headache or stiff neck.  You are confused.  You have severe abdominal pain.  You have chest pain or trouble breathing.  You do not urinate at least once every 8 hours.  You develop cold or clammy skin.  You continue to vomit for longer than 24 to 48 hours.  You have a fever. MAKE SURE YOU:   Understand these instructions.  Will watch your condition.  Will get help right away if you are not doing well or get worse. Document Released: 01/08/2005 Document Revised: 04/02/2011 Document Reviewed: 06/07/2010 [facility] Patient Information 2015 [place], [place]. This information is not intended to replace advice given to you by your health care provider. Make sure you discuss any questions you have with your health care provider.

## 2014-01-02 NOTE — Discharge Instructions (Signed)
Benign Positional Vertigo Vertigo means you feel like you or your surroundings are moving when they are not. Benign positional vertigo is the most common form of vertigo. Benign means that the cause of your condition is not serious. Benign positional vertigo is more common in older adults. CAUSES  Benign positional vertigo is the result of an upset in the labyrinth system. This is an area in the middle ear that helps control your balance. This may be caused by a viral infection, head injury, or repetitive motion. However, often no specific cause is found. SYMPTOMS  Symptoms of benign positional vertigo occur when you move your head or eyes in different directions. Some of the symptoms may include:  Loss of balance and falls.  Vomiting.  Blurred vision.  Dizziness.  Nausea.  Involuntary eye movements (nystagmus). DIAGNOSIS  Benign positional vertigo is usually diagnosed by physical exam. If the specific cause of your benign positional vertigo is unknown, your caregiver may perform imaging tests, such as magnetic resonance imaging (MRI) or computed tomography (CT). TREATMENT  Your caregiver may recommend movements or procedures to correct the benign positional vertigo. Medicines such as meclizine, benzodiazepines, and medicines for nausea may be used to treat your symptoms. In rare cases, if your symptoms are caused by certain conditions that affect the inner ear, you may need surgery. HOME CARE INSTRUCTIONS   Follow your caregiver's instructions.  Move slowly. Do not make sudden body or head movements.  Avoid driving.  Avoid operating heavy machinery.  Avoid performing any tasks that would be dangerous to you or others during a vertigo episode.  Drink enough fluids to keep your urine clear or pale yellow. SEEK IMMEDIATE MEDICAL CARE IF:   You develop problems with walking, weakness, numbness, or using your arms, hands, or legs.  You have difficulty speaking.  You develop  severe headaches.  Your nausea or vomiting continues or gets worse.  You develop visual changes.  Your family or friends notice any behavioral changes.  Your condition gets worse.  You have a fever.  You develop a stiff neck or sensitivity to light. MAKE SURE YOU:   Understand these instructions.  Will watch your condition.  Will get help right away if you are not doing well or get worse. Document Released: 10/16/2005 Document Revised: 04/02/2011 Document Reviewed: 09/28/2010 [employer] Patient Information 2015 [employer]. This information is not intended to replace advice given to you by your health care provider. Make sure you discuss any questions you have with your health care provider.    

## 2014-01-02 NOTE — ED Notes (Signed)
MD at bedside. Dr. Allen at bedside.  

## 2014-01-02 NOTE — ED Notes (Signed)
MD at bedside. Hospitalist at bedside. 

## 2014-01-02 NOTE — Consult Note (Addendum)
Medical Consultation  Robin Tucker NOB:[id number] DOB: 1950/05/02 DOA: 01/02/2014 PCP: Ellsworth Lennox, MD   Requesting physician: Dr. Lacretia Leigh Date of consultation: 01/02/14 Reason for consultation: vertigo  Impression/Recommendations Vertigo -likely vestibular neuritis although cannot completely rule out cardiac etiology versus carotid stenosis versus vertebrobasilar insufficiency -Symptoms were only minimally improved with Phenergan and meclizine in the emergency department -MRI brain negative for any acute findings -Urinalysis and BMP suggest a component of volume depletion/dehydration which may be also contributing to her vertigo -I have offered the patient further symptomatic treatment including but not limited to further anticholinergic treatment, antiemetics, and possible steroid therapy which may shorten the duration of her symptoms -I have also discussed other possible etiologies and offered other diagnostic modalities to rule out other etiologies -After a prolonged discussion and answering all the patient's questions, the patient declines admission -The patient's vital signs were stable in the emergency department -I have informed Dr. Lacretia Leigh of the patient's desire to be discharged and refusal to be admitted to the hospital--pt also requested Rx for meclizine and phenergan -please see HPI below for full discussion  Chief Complaint: vertigo  HPI:  63 year old female with no documented chronic medical problems presented to the emergency department from urgent care earlier in the day on 01/02/2014 secondary to vertigo. The patient developed vertigo on 12/31/2013 and went to the emergency department at that time. The patient was treated symptomatically and improved in the emergency department. She did not want to be admitted, and she was discharged in stable condition. One her symptoms began on 12/31/2013, the patient had numerous episodes of vomiting. She had  another 2 episodes of vomiting after she went home from the emergency department on 12/31/2013. Her symptoms persisted, and she went to urgent care on 01/02/2014. She was transferred to the emergency department by EMS for further evaluation.  Patient denies fevers, chills, headache, chest pain, dyspnea, nausea, vomiting, diarrhea, abdominal pain, dysuria, hematuria The patient denies taking any new medications or any other new supplements over-the-counter. Workup at urgent care on 01/02/14 revealed sodium 146, unremarkable CBC, urinalysis with specific gravity >1/030 with ketonuria but without pyuria. EKG on 12/31/2013 was sinus rhythm without any ST-T wave changes. The patient was afebrile and hemodynamically stable without any tachycardia. MRI of the brain on 01/02/2014 was negative for any acute findings. Based upon her lab work and persistent symptoms in the emergency department, admission was advised. I came to evaluate the patient at which time the patient was very irate that she was not previously informed that she would be admitted and that she was not informed about all her laboratory and radiographic studies.  I reviewed all the patient's lab and radiographic studies with the patient.  She expressed understanding.  After performing a physical exam and getting more history, I suggested observation admission for continued symptomatic treatment and possible additional workup to rule out other causes of her vertigo. At that point, the patient continued to be irate stating that "all these doctors give me the run around, and all you do is run up my bill when I can have these tests done for cheaper elsewhere and have medicines given to me cheaper outside the hospital." A chaperone, RN Enid Cutter, was present during my physical exam and entire discussion with the patient. The patient stated that when she had a similar episode in August 2015, it took almost 1 month for her symptoms to improve. She stated that  "why should I expect you to make me  better overnight by doing more tests if my symptoms lasted nearly 1 month in the past and they resolved on their own without any of your help". After discussing the risks, benefits, and alternatives of further workup and staying in the hospital as well as answering all of the patient's questions, the patient decided that she will be better served by going home. I stated that I would inform the ER physician who would do the official discharge. I also stated that the patient will need transportation to transfer her home.   Review of Systems:  Constitutional:  No weight loss, night sweats Head&Eyes: No headache.  No vision loss.  No eye pain or scotoma ENT:  No Difficulty swallowing,Tooth/dental problems,Sore throat,  No ear ache Cardio-vascular:  No chest pain, Orthopnea, PND, swelling in lower extremities,  dizziness, palpitations  GI:  No heartburn, indigestion, abdominal pain, nausea, vomiting, diarrhea, loss of appetite, hematochezia, melena Resp:  No shortness of breath with exertion or at rest. No excess mucus, no productive cough, No non-productive cough, No coughing up of blood. Skin:  no rash or lesions.  GU:  no dysuria, change in color of urine, no urgency or frequency.  Musculoskeletal:  No joint pain or swelling. No decreased range of motion. No back pain.  Psych:  No change in mood or affect. Neurologic: No headache, no dysesthesia, no focal weakness, no vision loss. No syncope   Past Medical History  Diagnosis Date  . Vertigo    History reviewed. No pertinent past surgical history. Social History:  reports that she has never smoked. She does not have any smokeless tobacco history on file. She reports that she does not drink alcohol or use illicit drugs.  Family History  Problem Relation Age of Onset  . Heart disease Mother     Allergies  Allergen Reactions  . Codeine Hives  . Naproxen Hives     Prior to Admission  medications   Medication Sig Start Date End Date Taking? Authorizing Provider  acetaminophen (TYLENOL) 500 MG tablet Take 1,000 mg by mouth every 6 (six) hours as needed for moderate pain (pain).   Yes Historical Provider, MD  Cholecalciferol (VITAMIN D3) 2000 UNITS capsule Take 2,000 Units by mouth daily.   Yes Historical Provider, MD  diazepam (VALIUM) 2 MG tablet Take one every 6 hours as needed for dizziness 12/31/13  Yes Threasa Beards, MD  meclizine (ANTIVERT) 25 MG tablet Take one or 2 tablets every 6 or 8 hours as needed for dizziness 08/26/13  Yes Posey Boyer, MD  Methylsulfonylmethane (MSM) 1000 MG TABS Take 1 tablet by mouth 3 (three) times daily.   Yes Historical Provider, MD  Misc Natural Products (LUNG TONIC) CAPS Take 1 capsule by mouth daily.    Yes Historical Provider, MD  Multiple Vitamins-Minerals (MULTIVITAMIN & MINERAL PO) Take 1 tablet by mouth daily.   Yes Historical Provider, MD  ondansetron (ZOFRAN ODT) 4 MG disintegrating tablet Take one every 6 hours as needed for nausea or vomiting. May be dissolved in mouth. 08/26/13  Yes Posey Boyer, MD  promethazine (PHENERGAN) 25 MG suppository Place 1 suppository (25 mg total) rectally every 6 (six) hours as needed for nausea or vomiting. 12/31/13  Yes Threasa Beards, MD    Physical Exam: Filed Vitals:   01/02/14 1337 01/02/14 1409 01/02/14 1741  BP: 121/66  130/58  Pulse: 79  79  Temp: 98 F (36.7 C)  98.6 F (37 C)  TempSrc: Oral  Oral  Resp: 16  18  Height:  5\' 4"  (1.626 m)   Weight:  104.327 kg (230 lb)   SpO2: 95%  97%   General:  A&O x 3, NAD, nontoxic, pleasant/cooperative Head/Eye: No conjunctival hemorrhage, no icterus, [place]/AT, No nystagmus ENT:  No icterus,  No thrush, good dentition, no pharyngeal exudate Neck:  No masses, no lymphadenpathy, no bruits CV:  RRR, no rub, no gallop, no S3 Lung:  CTAB, good air movement, no wheeze, no rhonchi Abdomen: soft/NT, +BS, nondistended, no peritoneal signs Ext: No  cyanosis, No rashes, No petechiae, No lymphangitis, No edema Neuro: CNII-XII intact, strength 4/5 in bilateral upper and lower extremities, no dysmetria  Labs on Admission:  Basic Metabolic Panel:  Recent Labs Lab 01/02/14 1442  NA 146  K 3.9  CL 107  CO2 26  GLUCOSE 88  BUN 13  CREATININE 0.88  CALCIUM 9.1   Liver Function Tests:  Recent Labs Lab 01/02/14 1442  AST 39*  ALT 56*  ALKPHOS 60  BILITOT 0.8  PROT 6.8  ALBUMIN 3.7   No results for input(s): LIPASE, AMYLASE in the last 168 hours. No results for input(s): AMMONIA in the last 168 hours. CBC:  Recent Labs Lab 01/02/14 1154  WBC 7.7  HGB 14.1  HCT 43.0  MCV 98.5*   Cardiac Enzymes: No results for input(s): CKTOTAL, CKMB, CKMBINDEX, TROPONINI in the last 168 hours. BNP: Invalid input(s): POCBNP CBG: No results for input(s): GLUCAP in the last 168 hours.  Radiological Exams on Admission: Mr Kizzie Fantasia Contrast  01/02/2014   CLINICAL DATA:  Severe vertigo and headaches, present for 2 days and worsening.  EXAM: MRI HEAD WITHOUT AND WITH CONTRAST  TECHNIQUE: Multiplanar, multiecho pulse sequences of the brain and surrounding structures were obtained without and with intravenous contrast.  CONTRAST:  57mL MULTIHANCE GADOBENATE DIMEGLUMINE 529 MG/ML IV SOLN  COMPARISON:  None.  FINDINGS: There is no acute infarct. Ventricles and sulci are normal for age. There is no evidence of intracranial hemorrhage, mass, midline shift, or extra-axial fluid collection. No significant white matter disease is seen. There is no abnormal enhancement.  Orbits are unremarkable. Focal right posterior ethmoid air cell and right sphenoid sinus mucosal thickening is noted. Major intracranial vascular flow voids are preserved. Calvarium and scalp soft tissues are unremarkable.  IMPRESSION: Unremarkable appearance of the brain.   Electronically Signed   By: Logan Bores   On: 01/02/2014 16:46    EKG: Independently reviewed.  12/31/2013--sinus rhythm without any ST-T wave changes   Time spent: total time 70 min  [place] [facility] Pager [phone]  If 7PM-7AM, please contact night-coverage [email or web address] Password [facility] 01/02/2014, 7:14 PM

## 2014-01-02 NOTE — ED Provider Notes (Signed)
CSN: [id number]     Arrival date & time 01/02/14  1323 History   First MD Initiated Contact with Patient 01/02/14 1329     Chief Complaint  Patient presents with  . Dizziness  . Nausea     (Consider location/radiation/quality/duration/timing/severity/associated sxs/prior Treatment) HPI Comments: The patient is a 63 year old female, she has a history of a recent onset of vertigo which started 2 days ago, she initially presented to the emergency department and receive medications for her perceived peripheral vertigo. This started when she moved her head to the side, seemed to improve somewhat with holding still but has been rather persistent. Back in August she had similar symptoms where she had no relief for over a month until it finally went away. During this period of time she has not had any imaging of her brain, she was sent here from the urgent care today for imaging and admission should she has had persistent symptoms over the last 2 days and is unable to tolerate any fluids by mouth without vomiting. This is now persistent, severe, not amenable to treatment with Valium and meclizine or Phenergan which only temporarily improves her symptoms.  Patient is a 63 y.o. female presenting with dizziness. The history is provided by the patient.  Dizziness   Past Medical History  Diagnosis Date  . Vertigo    History reviewed. No pertinent past surgical history. Family History  Problem Relation Age of Onset  . Heart disease Mother    History  Substance Use Topics  . Smoking status: Never Smoker   . Smokeless tobacco: Not on file  . Alcohol Use: No   OB History    No data available     Review of Systems  Neurological: Positive for dizziness.  All other systems reviewed and are negative.     Allergies  Codeine and Naproxen  Home Medications   Prior to Admission medications   Medication Sig Start Date End Date Taking? Authorizing Provider  acetaminophen (TYLENOL) 500 MG  tablet Take 1,000 mg by mouth every 6 (six) hours as needed for moderate pain (pain).   Yes Historical Provider, MD  Cholecalciferol (VITAMIN D3) 2000 UNITS capsule Take 2,000 Units by mouth daily.   Yes Historical Provider, MD  diazepam (VALIUM) 2 MG tablet Take one every 6 hours as needed for dizziness 12/31/13  Yes Threasa Beards, MD  meclizine (ANTIVERT) 25 MG tablet Take one or 2 tablets every 6 or 8 hours as needed for dizziness 08/26/13  Yes Posey Boyer, MD  Methylsulfonylmethane (MSM) 1000 MG TABS Take 1 tablet by mouth 3 (three) times daily.   Yes Historical Provider, MD  Misc Natural Products (LUNG TONIC) CAPS Take 1 capsule by mouth daily.    Yes Historical Provider, MD  Multiple Vitamins-Minerals (MULTIVITAMIN & MINERAL PO) Take 1 tablet by mouth daily.   Yes Historical Provider, MD  ondansetron (ZOFRAN ODT) 4 MG disintegrating tablet Take one every 6 hours as needed for nausea or vomiting. May be dissolved in mouth. 08/26/13  Yes Posey Boyer, MD  promethazine (PHENERGAN) 25 MG suppository Place 1 suppository (25 mg total) rectally every 6 (six) hours as needed for nausea or vomiting. 12/31/13  Yes Threasa Beards, MD   BP 130/58 mmHg  Pulse 79  Temp(Src) 98.6 F (37 C) (Oral)  Resp 18  Ht 5\' 4"  (1.626 m)  Wt 230 lb (104.327 kg)  BMI 39.46 kg/m2  SpO2 97% Physical Exam  Constitutional: She appears well-developed and  well-nourished. No distress.  HENT:  Head: Normocephalic and atraumatic.  Mouth/Throat: Oropharynx is clear and moist. No oropharyngeal exudate.  Eyes: Conjunctivae and EOM are normal. Pupils are equal, round, and reactive to light. Right eye exhibits no discharge. Left eye exhibits no discharge. No scleral icterus.  Neck: Normal range of motion. Neck supple. No JVD present. No thyromegaly present.  Cardiovascular: Normal rate, regular rhythm, normal heart sounds and intact distal pulses.  Exam reveals no gallop and no friction rub.   No murmur  heard. Pulmonary/Chest: Effort normal and breath sounds normal. No respiratory distress. She has no wheezes. She has no rales.  Abdominal: Soft. Bowel sounds are normal. She exhibits no distension and no mass. There is no tenderness.  Musculoskeletal: Normal range of motion. She exhibits no edema or tenderness.  Lymphadenopathy:    She has no cervical adenopathy.  Neurological: She is alert. Coordination normal.  Follows commands, finger-nose-finger and heel shin are normal, normal strength in all 4 extremities, cranial nerves III through XII are normal, any movement of the head induces significant nausea and dizziness. No obvious nystagmus.  Skin: Skin is warm and dry. No rash noted. No erythema.  Psychiatric: She has a normal mood and affect. Her behavior is normal.  Nursing note and vitals reviewed.   ED Course  Procedures (including critical care time) Labs Review Labs Reviewed  COMPREHENSIVE METABOLIC PANEL - Abnormal; Notable for the following:    AST 39 (*)    ALT 56 (*)    GFR calc non Af Amer 69 (*)    GFR calc Af Amer 81 (*)    All other components within normal limits    Imaging Review Mr Jeri Cos Wo Contrast  01/02/2014   CLINICAL DATA:  Severe vertigo and headaches, present for 2 days and worsening.  EXAM: MRI HEAD WITHOUT AND WITH CONTRAST  TECHNIQUE: Multiplanar, multiecho pulse sequences of the brain and surrounding structures were obtained without and with intravenous contrast.  CONTRAST:  19mL MULTIHANCE GADOBENATE DIMEGLUMINE 529 MG/ML IV SOLN  COMPARISON:  None.  FINDINGS: There is no acute infarct. Ventricles and sulci are normal for age. There is no evidence of intracranial hemorrhage, mass, midline shift, or extra-axial fluid collection. No significant white matter disease is seen. There is no abnormal enhancement.  Orbits are unremarkable. Focal right posterior ethmoid air cell and right sphenoid sinus mucosal thickening is noted. Major intracranial vascular flow  voids are preserved. Calvarium and scalp soft tissues are unremarkable.  IMPRESSION: Unremarkable appearance of the brain.   Electronically Signed   By: Logan Bores   On: 01/02/2014 16:46      MDM   Final diagnoses:  Vertigo    The patient has no significant limb ataxia, I hesitate to get her up to walk secondary to her severe symptoms, we'll perform MRI, likely admission to the hospital for ongoing symptomatically treatment. Medications and fluids ordered, labs reviewed from the urgent care including a normal CBC and a urinalysis consistent with dehydration.  MRI pending, CMP without acute findings - change of shift, care signed out to Dr. Zenia Resides - anticipate admission  Meds given in ED:  Medications  promethazine (PHENERGAN) injection 25 mg (not administered)  diazepam (VALIUM) injection 2.5 mg (2.5 mg Intravenous Given 01/02/14 1405)  meclizine (ANTIVERT) tablet 25 mg (25 mg Oral Given 01/02/14 1405)  gadobenate dimeglumine (MULTIHANCE) injection 20 mL (20 mLs Intravenous Contrast Given 01/02/14 1605)    New Prescriptions   No medications on file  Johnna Acosta, MD 01/02/14 [phone]

## 2014-01-02 NOTE — ED Notes (Signed)
Pt returned from MRI °

## 2014-01-02 NOTE — ED Notes (Signed)
Bed: BO17 Expected date: 01/02/14 Expected time: 1:16 PM Means of arrival:  Comments: n/v

## 2014-01-03 DIAGNOSIS — R42 Dizziness and giddiness: Secondary | ICD-10-CM

## 2014-01-03 DIAGNOSIS — R112 Nausea with vomiting, unspecified: Secondary | ICD-10-CM | POA: Diagnosis present

## 2014-01-03 LAB — BASIC METABOLIC PANEL
Anion gap: 11 (ref 5–15)
BUN: 12 mg/dL (ref 6–23)
CO2: 26 mEq/L (ref 19–32)
Calcium: 8.8 mg/dL (ref 8.4–10.5)
Chloride: 103 mEq/L (ref 96–112)
Creatinine, Ser: 0.84 mg/dL (ref 0.50–1.10)
GFR calc Af Amer: 85 mL/min — ABNORMAL LOW (ref >90)
GFR calc non Af Amer: 74 mL/min — ABNORMAL LOW (ref >90)
GLUCOSE: 102 mg/dL — AB (ref 70–99)
POTASSIUM: 4.3 meq/L (ref 3.7–5.3)
Sodium: 140 mEq/L (ref 137–147)

## 2014-01-03 LAB — CBC
HCT: 40.4 % (ref 36.0–46.0)
Hemoglobin: 13.3 g/dL (ref 12.0–15.0)
MCH: 32.8 pg (ref 26.0–34.0)
MCHC: 32.9 g/dL (ref 30.0–36.0)
MCV: 99.5 fL (ref 78.0–100.0)
PLATELETS: 228 10*3/uL (ref 150–400)
RBC: 4.06 MIL/uL (ref 3.87–5.11)
RDW: 13.1 % (ref 11.5–15.5)
WBC: 8.3 10*3/uL (ref 4.0–10.5)

## 2014-01-03 LAB — TSH: TSH: 3.462 u[IU]/mL (ref 0.350–4.500)

## 2014-01-03 MED ORDER — INFLUENZA VAC SPLIT QUAD 0.5 ML IM SUSY: 0.5000 mL | PREFILLED_SYRINGE | INTRAMUSCULAR | Status: DC

## 2014-01-03 MED ORDER — DIAZEPAM 5 MG PO TABS
5.0000 mg | ORAL_TABLET | Freq: Four times a day (QID) | ORAL | Status: DC | PRN
  Administered 2014-01-03 – 2014-01-04 (×2): 5 mg via ORAL
  Filled 2014-01-03 (×3): qty 1

## 2014-01-03 NOTE — Progress Notes (Signed)
Pt admitted after midnight. Please see Dr.Jenkins admission note. Pt admitted for vertigo evaluation. Continue Valium and Meclizine and if no improvement in 24 hours, will consult neurology. D/W neurologist on call.   Faye Ramsay, MD  [facility] Pager [phone]  If 7PM-7AM, please contact night-coverage [email or web address] Password [email or web address]

## 2014-01-03 NOTE — Plan of Care (Signed)
Problem: Phase I Progression Outcomes Goal: Voiding-avoid urinary catheter unless indicated Outcome: Not Applicable Date Met:  03/49/61 Has foley

## 2014-01-03 NOTE — H&P (Signed)
[facility] Admission History and Physical       Robin Tucker:[id number] DOB: [birth year] DOA: 01/02/2014  Referring physician: EDP PCP: Ellsworth Lennox, MD  Specialists:   Chief Complaint: Dizziness  HPI: Robin Tucker is a 63 y.o. female with a history of episodes of Vertigo in the past who presents to  the ED with complaints of severe dizziness causing nausea and vomiting when she turns her head for the past 4-5 days.  She denies any URI symptoms and symptoms of Headache, Earache or  fever or chills.  She also denies any history of Hearing problems.  She was seen at the [facility] in the afternoon and was referred to the ED for an MRI of the Brain and admission.   An MRI of the Brain was done and was negative for findings of posterior circulation pathology.  She is unable to walk without falling and exacerbating her symptoms.  She was referred for medical admission.     Review of Systems:  Constitutional: No Weight Loss, No Weight Gain, Night Sweats, Fevers, Chills, +Dizziness, Fatigue, or Generalized Weakness HEENT: No Headaches, Difficulty Swallowing,Tooth/Dental Problems,Sore Throat,  No Sneezing, Rhinitis, Ear Ache, Nasal Congestion, or Post Nasal Drip,  Cardio-vascular:  No Chest pain, Orthopnea, PND, Edema in Lower Extremities, Anasarca, Dizziness, Palpitations  Resp: No Dyspnea, No DOE, No Productive Cough, No Non-Productive Cough, No Hemoptysis, No Wheezing.    GI: No Heartburn, Indigestion, Abdominal Pain, +Nausea, +Vomiting, Diarrhea, Hematemesis, Hematochezia, Melena, Change in Bowel Habits,  Loss of Appetite  GU: No Dysuria, Change in Color of Urine, No Urgency or Frequency, No Flank pain.  Musculoskeletal: No Joint Pain or Swelling, No Decreased Range of Motion, No Back Pain.  Neurologic: No Syncope, No Seizures, Muscle Weakness, Paresthesia, Vision Disturbance or Loss, No Diplopia, +No Vertigo, +Difficulty Walking,  Skin: No Rash or  Lesions. Psych: No Change in Mood or Affect, No Depression or Anxiety, No Memory loss, No Confusion, or Hallucinations   Past Medical History  Diagnosis Date  . Vertigo       History reviewed. No pertinent past surgical history.     Prior to Admission medications   Medication Sig Start Date End Date Taking? Authorizing Provider  acetaminophen (TYLENOL) 500 MG tablet Take 1,000 mg by mouth every 6 (six) hours as needed for moderate pain (pain).   Yes Historical Provider, MD  Cholecalciferol (VITAMIN D3) 2000 UNITS capsule Take 2,000 Units by mouth daily.   Yes Historical Provider, MD  Methylsulfonylmethane (MSM) 1000 MG TABS Take 1 tablet by mouth 3 (three) times daily.   Yes Historical Provider, MD  Misc Natural Products (LUNG TONIC) CAPS Take 1 capsule by mouth daily.    Yes Historical Provider, MD  Multiple Vitamins-Minerals (MULTIVITAMIN & MINERAL PO) Take 1 tablet by mouth daily.   Yes Historical Provider, MD  ondansetron (ZOFRAN ODT) 4 MG disintegrating tablet Take one every 6 hours as needed for nausea or vomiting. May be dissolved in mouth. 08/26/13  Yes Posey Boyer, MD  promethazine (PHENERGAN) 25 MG suppository Place 1 suppository (25 mg total) rectally every 6 (six) hours as needed for nausea or vomiting. 12/31/13  Yes Threasa Beards, MD  diazepam (VALIUM) 2 MG tablet Take one every 6 hours as needed for dizziness 01/02/14   Leota Jacobsen, MD  meclizine (ANTIVERT) 25 MG tablet Take one or 2 tablets every 6 or 8 hours as needed for dizziness 01/02/14   Leota Jacobsen, MD  Allergies  Allergen Reactions  . Codeine Hives  . Naproxen Hives     Social History:  reports that she has never smoked. She does not have any smokeless tobacco history on file. She reports that she does not drink alcohol or use illicit drugs.     Family History  Problem Relation Age of Onset  . Heart disease Mother        Physical Exam:  GEN:  Pleasant Obese  63 y.o.Caucasian female  examined  and in no acute distress; cooperative with exam Filed Vitals:   01/02/14 1741 01/02/14 1934 01/02/14 2130 01/02/14 2225  BP: 130/58 172/70 138/66 129/52  Pulse: 79 95 75 75  Temp: 98.6 F (37 C)   98 F (36.7 C)  TempSrc: Oral   Oral  Resp: 18 18 18 20   Height:      Weight:      SpO2: 97% 98% 95% 96%   Blood pressure 129/52, pulse 75, temperature 98 F (36.7 C), temperature source Oral, resp. rate 20, height 5\' 4"  (1.626 m), weight 104.327 kg (230 lb), SpO2 96 %. PSYCH: She is alert and oriented x4; does not appear anxious does not appear depressed; affect is normal HEENT: Normocephalic and Atraumatic, Mucous membranes pink; PERRLA; EOM intact; Fundi:  Benign;  No scleral icterus, Nares: Patent, Oropharynx: Clear, Fair Dentition,    Neck:  FROM, No Cervical Lymphadenopathy nor Thyromegaly or Carotid Bruit; No JVD; Breasts:: Not examined CHEST WALL: No tenderness CHEST: Normal respiration, clear to auscultation bilaterally HEART: Regular rate and rhythm; no murmurs rubs or gallops BACK: No kyphosis or scoliosis; No CVA tenderness ABDOMEN: Positive Bowel Sounds,  Obese, Soft Non-Tender; No Masses, No Organomegaly. Rectal Exam: Not done EXTREMITIES: No Cyanosis, Clubbing, or Edema; No Ulcerations. Genitalia: not examined PULSES: 2+ and symmetric SKIN: Normal hydration no rash or ulceration  CNS:   Mental Status:  Alert, Oriented, Thought Content Appropriate. Speech Fluent without evidence of Aphasia. Able to follow 3 step commands without difficulty.  In No obvious pain.   Cranial Nerves:  II: Discs flat bilaterally; Visual fields Intact, Pupils equal and reactive.    III,IV, VI: Extra-ocular motions intact bilaterally    V,VII: smile symmetric, facial light touch sensation normal bilaterally    VIII: hearing intact bilaterally    IX,X: gag reflex present    XI: bilateral shoulder shrug    XII: midline tongue extension   Motor:  Right:  Upper extremity 5/5     Left:   Upper extremity 5/5     Right:  Lower extremity 5/5    Left:  Lower extremity 5/5     Tone and Bulk:  normal tone throughout; no atrophy noted   Sensory:  Pinprick and light touch intact throughout, bilaterally   Deep Tendon Reflexes: 2+ and symmetric throughout   Plantars/ Babinski:  Right: normal Left: normal     Cerebellar:  Finger to nose with difficulty.   Gait: deferred   Vascular: pulses palpable throughout    Labs on Admission:  Basic Metabolic Panel:  Recent Labs Lab 01/02/14 1146 01/02/14 1442  NA 141 146  K 4.1 3.9  CL 104 107  CO2 25 26  GLUCOSE 112* 88  BUN 14 13  CREATININE 0.94 0.88  CALCIUM 9.5 9.1   Liver Function Tests:  Recent Labs Lab 01/02/14 1146 01/02/14 1442  AST 40* 39*  ALT 55* 56*  ALKPHOS 58 60  BILITOT 0.9 0.8  PROT 7.0 6.8  ALBUMIN 4.3 3.7  No results for input(s): LIPASE, AMYLASE in the last 168 hours. No results for input(s): AMMONIA in the last 168 hours. CBC:  Recent Labs Lab 01/02/14 1154  WBC 7.7  HGB 14.1  HCT 43.0  MCV 98.5*   Cardiac Enzymes: No results for input(s): CKTOTAL, CKMB, CKMBINDEX, TROPONINI in the last 168 hours.  BNP (last 3 results) No results for input(s): PROBNP in the last 8760 hours. CBG: No results for input(s): GLUCAP in the last 168 hours.  Radiological Exams on Admission: Mr Kizzie Fantasia Contrast  01/02/2014   CLINICAL DATA:  Severe vertigo and headaches, present for 2 days and worsening.  EXAM: MRI HEAD WITHOUT AND WITH CONTRAST  TECHNIQUE: Multiplanar, multiecho pulse sequences of the brain and surrounding structures were obtained without and with intravenous contrast.  CONTRAST:  24mL MULTIHANCE GADOBENATE DIMEGLUMINE 529 MG/ML IV SOLN  COMPARISON:  None.  FINDINGS: There is no acute infarct. Ventricles and sulci are normal for age. There is no evidence of intracranial hemorrhage, mass, midline shift, or extra-axial fluid collection. No significant white matter disease is seen. There is  no abnormal enhancement.  Orbits are unremarkable. Focal right posterior ethmoid air cell and right sphenoid sinus mucosal thickening is noted. Major intracranial vascular flow voids are preserved. Calvarium and scalp soft tissues are unremarkable.  IMPRESSION: Unremarkable appearance of the brain.   Electronically Signed   By: Logan Bores   On: 01/02/2014 16:46     EKG: Independently reviewed.    Assessment/Plan:   63 y.o. female with  Principal Problem:   1.   Vertigo vs  Vertigo, benign positional vs Meniere's Disease- MRI - Negative for Acute findings   Continue Meclizine and Valium Rx   Neuro Checks   Check Orthostatics   Neuro consult in AM   Fall Precautions    Refer to Neuro Physical Therapy   Active Problems:    2.   Nausea with vomiting   IV Zofran  PRN Nausea     3.   Morbid obesity   Chronic    Code Status:  FULL CODE Family Communication:    Family at Bedside Disposition Plan:         Time spent:  [place] [facility] Pager [phone]   If [place] Please Contact the Day Rounding Team MD for [facility]  If 7PM-7AM, Please Contact Night-Floor Coverage  [email or web address] Password [email or web address] 01/03/2014, 12:14 AM     ADDENDUM:   Patient was seen and examined on 01/02/2014

## 2014-01-04 LAB — CBC
HEMATOCRIT: 38.2 % (ref 36.0–46.0)
Hemoglobin: 12.3 g/dL (ref 12.0–15.0)
MCH: 31.9 pg (ref 26.0–34.0)
MCHC: 32.2 g/dL (ref 30.0–36.0)
MCV: 99.2 fL (ref 78.0–100.0)
PLATELETS: 216 10*3/uL (ref 150–400)
RBC: 3.85 MIL/uL — ABNORMAL LOW (ref 3.87–5.11)
RDW: 13 % (ref 11.5–15.5)
WBC: 8.1 10*3/uL (ref 4.0–10.5)

## 2014-01-04 LAB — BASIC METABOLIC PANEL
ANION GAP: 11 (ref 5–15)
BUN: 10 mg/dL (ref 6–23)
CO2: 25 mEq/L (ref 19–32)
Calcium: 9 mg/dL (ref 8.4–10.5)
Chloride: 107 mEq/L (ref 96–112)
Creatinine, Ser: 0.87 mg/dL (ref 0.50–1.10)
GFR calc non Af Amer: 70 mL/min — ABNORMAL LOW (ref >90)
GFR, EST AFRICAN AMERICAN: 82 mL/min — AB (ref >90)
Glucose, Bld: 95 mg/dL (ref 70–99)
Potassium: 3.9 mEq/L (ref 3.7–5.3)
Sodium: 143 mEq/L (ref 137–147)

## 2014-01-04 MED ORDER — DIAZEPAM 5 MG PO TABS: ORAL_TABLET | ORAL | Status: DC

## 2014-01-04 MED ORDER — PROMETHAZINE HCL 25 MG PO TABS: 25.0000 mg | ORAL_TABLET | Freq: Four times a day (QID) | ORAL | Status: DC | PRN

## 2014-01-04 MED ORDER — MECLIZINE HCL 25 MG PO TABS: ORAL_TABLET | ORAL | Status: DC

## 2014-01-04 NOTE — Progress Notes (Signed)
Rn reviewed discharge instructions with patient and son. All questions answered.   Paperwork and prescriptions given.   NT rolled patient down in wheelchair to family car.

## 2014-01-04 NOTE — Progress Notes (Signed)
Physical Therapy Treatment Note    01/04/14 1440  PT Visit Information  Last PT Received On 01/04/14  Assistance Needed +1  History of Present Illness 63year old woman with prior history of vertigo presenting for acute onset vertigo. Per neurologist note: "Based on description her symptoms appear peripheral in nature. MRI brain reviewed and overall unremarkable, no signs of ischemic infarct"  PT Time Calculation  PT Start Time (ACUTE ONLY) 1412  PT Stop Time (ACUTE ONLY) 1436  PT Time Calculation (min) (ACUTE ONLY) 24 min  Subjective Data  Subjective Pt assisted to bathroom and reports feeling less dizzy 4.5/10 at this time.  Pt without trunk swaying upon standing as well.  Pt assisted with donning pants and shoes in bathroom and educated to be seated while starting to dress for safety.  Pt then assisted back to bed and educated on vestibular exercise.  Pt performed 4 gaze stability and habituation exercises sitting EOB.  Exercises performed twice each to ensure pt understood.  Also reviewed safely progressing and symptoms to monitor and call/report back to MD if they occur.  Pt provided with handouts on each exercises as well as outpatient referral for vestibular rehab.  Pt had no further questions and plans to d/c today.  Precautions  Precautions Fall  Pain Assessment  Pain Assessment No/denies pain  Cognition  Arousal/Alertness Awake/alert  Behavior During Therapy WFL for tasks assessed/performed  Overall Cognitive Status Within Functional Limits for tasks assessed  Bed Mobility  Overal bed mobility Needs Assistance  Bed Mobility Sit to Supine  Sit to supine Supervision  Transfers  Overall transfer level Needs assistance  Equipment used Rolling walker (2 wheeled)  Transfers Sit to/from Stand  Sit to Stand Min guard  General transfer comment pt transfer more steady this afternoon, from bed and elevated toilet  Ambulation/Gait  Ambulation/Gait assistance Min guard  Ambulation  Distance (Feet) 16 Feet  Assistive device Rolling walker (2 wheeled)  Gait Pattern/deviations Step-through pattern;Trunk flexed  Gait velocity decr  General Gait Details pt able to tolerate ambulating to/from bathroom with RW, verbal cues for focusing on stationary object and performing little head movement during mobility to decrease dizziness  Exercises  Exercises Other exercises  Other Exercises  Other Exercises 2 gaze stability and 2 habituation exercises performed twice each at EOB, handouts provided  PT - End of Session  Equipment Utilized During Treatment Gait belt  Activity Tolerance Patient tolerated treatment well  Patient left in bed;with call bell/phone within reach;with family/visitor present;with nursing/sitter in room  PT - Assessment/Plan  PT Plan Current plan remains appropriate  PT Frequency (ACUTE ONLY) Min 4X/week  Follow Up Recommendations Outpatient PT  PT equipment 3in1 (PT) (which pt declines)  PT Goal Progression  Progress towards PT goals Progressing toward goals  PT General Charges  $$ ACUTE PT VISIT 1 Procedure  PT Treatments  $Gait Training 8-22 mins  $Therapeutic Exercise 8-22 mins   Carmelia Bake, PT, DPT 01/04/2014 Pager: [phone]

## 2014-01-04 NOTE — Progress Notes (Signed)
Pt cont to refuse Lovenox but is now wearing bilat scd's, sons at bedside asking multiple questions r/t meds and orders  and texting all  info given,what meds.times etc into their cell phones.Pt is irritable,argumentative,restless.States she has dizziness even when slightly turning her head the slightest.was able to stand at bedside holding walker for staff to obtain orthostatic VS.See flowsheet.Also c/o some mild mid forehead headache,wearing cool washcloth.valium 5mg  po given for anxiety,states takes tid at home.Foley cath remains  in output 800 ml since 6pm.RN shift report that foley was in due to previous urinary retention.Will send sticky note for order if to leave in. Aggie Moats D

## 2014-01-04 NOTE — Consult Note (Addendum)
Consult Reason for Consult:vertigo Referring Physician: Dr Doyle Askew  CC: vertigo   HPI: Robin Tucker is an 63 y.o. female Robin Tucker is a 64 y.o. female with a history of episodes of Vertigo in the past who presents to the ED with complaints of severe dizziness causing nausea and vomiting when she turns her head to the right for the past 4-5 days. She denies any URI symptoms and symptoms of Headache, Earache or fever or chills. She also denies any history of Hearing problems. She was seen at the [facility] in the afternoon and was referred to the ED for an MRI of the Brain and admission. An MRI of the Brain with and without was done and was negative for findings of posterior circulation pathology. She is unable to walk without falling and exacerbating her symptoms. She was referred for medical admission.   Was treated with meclizine and valium with no improvement. Patient checked for orthostatic hypotension and this was negative. She notes continued vertigo type sensation made worse with any movement. Improves with closure of eyes.   Past Medical History  Diagnosis Date  . Vertigo     History reviewed. No pertinent past surgical history.  Family History  Problem Relation Age of Onset  . Heart disease Mother     Social History:  reports that she has never smoked. She does not have any smokeless tobacco history on file. She reports that she does not drink alcohol or use illicit drugs.  Allergies  Allergen Reactions  . Codeine Hives  . Naproxen Hives    Medications:  Scheduled: . cholecalciferol  2,000 Units Oral Daily  . enoxaparin (LOVENOX) injection  40 mg Subcutaneous QHS    MRI brain imaging reviewed and overall unremarkable.   ROS: Out of a complete 14 system review, the patient complains of only the following symptoms, and all other reviewed systems are negative. +vertigo, gait instability  Physical Examination: Filed Vitals:   01/04/14 1034   BP: 117/55  Pulse: 82  Temp: 99 F (37.2 C)  Resp: 18   Physical Exam  Constitutional: She appears well-developed and well-nourished.  Psych: Affect appropriate to situation Eyes: No scleral injection HENT: No OP obstrucion Head: Normocephalic.  Cardiovascular: Normal rate and regular rhythm.  Respiratory: Effort normal and breath sounds normal.  GI: Soft. Bowel sounds are normal. No distension. There is no tenderness.  Skin: WDI  Neurologic Examination Mental Status: Alert, oriented, thought content appropriate.  Speech fluent without evidence of aphasia.  Able to follow 3 step commands without difficulty. Cranial Nerves: II: funduscopic exam wnl bilaterally, visual fields grossly normal, pupils equal, round, reactive to light and accommodation III,IV, VI: ptosis not present, extra-ocular motions intact bilaterally V,VII: smile symmetric, facial light touch sensation normal bilaterally VIII: hearing normal bilaterally IX,X: gag reflex present XI: trapezius strength/neck flexion strength normal bilaterally XII: tongue strength normal  Motor: Right : Upper extremity    Left:     Upper extremity 5/5 deltoid       5/5 deltoid 5/5 biceps      5/5 biceps  5/5 triceps      5/5 triceps 5/5 hand grip      5/5 hand grip  Lower extremity     Lower extremity 5/5 hip flexor      5/5 hip flexor 5/5 quadricep      5/5 quadriceps  5/5 hamstrings     5/5 hamstrings 5/5 plantar flexion       5/5  plantar flexion 5/5 plantar extension     5/5 plantar extension Tone and bulk:normal tone throughout; no atrophy noted Sensory: Pinprick and light touch intact throughout, bilaterally Deep Tendon Reflexes: 2+ and symmetric throughout Plantars: Right: downgoing   Left: downgoing Cerebellar: normal finger-to-nose, normal rapid alternating movements and normal heel-to-shin test Gait: deferred due to fall risk  Laboratory Studies:   Basic Metabolic Panel:  Recent Labs Lab 01/02/14 1146  01/02/14 1442 01/03/14 0530 01/04/14 0440  NA 141 146 140 143  K 4.1 3.9 4.3 3.9  CL 104 107 103 107  CO2 25 26 26 25   GLUCOSE 112* 88 102* 95  BUN 14 13 12 10   CREATININE 0.94 0.88 0.84 0.87  CALCIUM 9.5 9.1 8.8 9.0    Liver Function Tests:  Recent Labs Lab 01/02/14 1146 01/02/14 1442  AST 40* 39*  ALT 55* 56*  ALKPHOS 58 60  BILITOT 0.9 0.8  PROT 7.0 6.8  ALBUMIN 4.3 3.7   No results for input(s): LIPASE, AMYLASE in the last 168 hours. No results for input(s): AMMONIA in the last 168 hours.  CBC:  Recent Labs Lab 01/02/14 1154 01/03/14 0530 01/04/14 0440  WBC 7.7 8.3 8.1  HGB 14.1 13.3 12.3  HCT 43.0 40.4 38.2  MCV 98.5* 99.5 99.2  PLT  --  228 216    Cardiac Enzymes: No results for input(s): CKTOTAL, CKMB, CKMBINDEX, TROPONINI in the last 168 hours.  BNP: Invalid input(s): POCBNP  CBG: No results for input(s): GLUCAP in the last 168 hours.  Microbiology: No results found for this or any previous visit.  Coagulation Studies: No results for input(s): LABPROT, INR in the last 72 hours.  Urinalysis:  Recent Labs Lab 12/31/13 1613 01/02/14 1153  COLORURINE YELLOW  --   LABSPEC 1.019  --   PHURINE 6.0  --   GLUCOSEU NEGATIVE  --   HGBUR NEGATIVE  --   BILIRUBINUR NEGATIVE mod  KETONESUR 15*  --   PROTEINUR NEGATIVE 100  UROBILINOGEN 0.2 1.0  NITRITE NEGATIVE neg  LEUKOCYTESUR NEGATIVE Trace    Lipid Panel:     Component Value Date/Time   CHOL 222* 01/02/2014 1146   TRIG 166* 01/02/2014 1146   HDL 49 01/02/2014 1146   CHOLHDL 4.5 01/02/2014 1146   VLDL 33 01/02/2014 1146   LDLCALC 140* 01/02/2014 1146    HgbA1C:  Lab Results  Component Value Date   HGBA1C 6.2 01/02/2014    Urine Drug Screen:  No results found for: LABOPIA, COCAINSCRNUR, LABBENZ, AMPHETMU, THCU, LABBARB  Alcohol Level: No results for input(s): ETH in the last 168 hours.   Imaging: Mr Jeri Cos Wo Contrast  01/02/2014   CLINICAL DATA:  Severe vertigo and  headaches, present for 2 days and worsening.  EXAM: MRI HEAD WITHOUT AND WITH CONTRAST  TECHNIQUE: Multiplanar, multiecho pulse sequences of the brain and surrounding structures were obtained without and with intravenous contrast.  CONTRAST:  27mL MULTIHANCE GADOBENATE DIMEGLUMINE 529 MG/ML IV SOLN  COMPARISON:  None.  FINDINGS: There is no acute infarct. Ventricles and sulci are normal for age. There is no evidence of intracranial hemorrhage, mass, midline shift, or extra-axial fluid collection. No significant white matter disease is seen. There is no abnormal enhancement.  Orbits are unremarkable. Focal right posterior ethmoid air cell and right sphenoid sinus mucosal thickening is noted. Major intracranial vascular flow voids are preserved. Calvarium and scalp soft tissues are unremarkable.  IMPRESSION: Unremarkable appearance of the brain.   Electronically Signed  By: Logan Bores   On: 01/02/2014 16:46     Assessment/Plan:  62y/o woman with prior history of vertigo presenting for acute onset vertigo. Based on description her symptoms appear peripheral in nature. MRI brain reviewed and overall unremarkable, no signs of ischemic infarct. Extensively discussed likely etiology and disease course with patient and her family.   -continue valium and zofran prn for symptomatic relief -PT consult for vestibular rehab -can follow up outpatient with ENT and neurology as needed. Would consider checking MRA brain. Discussed with patient and she wishes to hold off on this at this time and complete it as outpatient -no further inpatient neurological workup indicated at this time.    Jim Like, DO [facility] [phone]  If 7pm- 7am, please page neurology on call as listed in [place]. 01/04/2014, 10:53 AM

## 2014-01-04 NOTE — Care Management Note (Signed)
    Page 1 of 1   01/04/2014     3:05:20 PM CARE MANAGEMENT NOTE 01/04/2014  Patient:  Robin Tucker, Robin Tucker   Account Number:  [id number]  Date Initiated:  01/04/2014  Documentation initiated by:  Sunday Spillers  Subjective/Objective Assessment:   63 yo female admitted with vertigo, ataxia. PTA lived at home with spouse.     Action/Plan:   Home when stable, OP PT   Anticipated DC Date:  01/04/2014   Anticipated DC Plan:  [place]  CM consult      Choice offered to / List presented to:             Status of service:  Completed, signed off Medicare Important Message given?   (If response is "NO", the following Medicare IM given date fields will be blank) Date Medicare IM given:   Medicare IM given by:   Date Additional Medicare IM given:   Additional Medicare IM given by:    Discharge Disposition:  HOME/SELF CARE  Per UR Regulation:  Reviewed for med. necessity/level of care/duration of stay  If discussed at [place] of Stay Meetings, dates discussed:    Comments:

## 2014-01-04 NOTE — Discharge Summary (Signed)
Physician Discharge Summary  ELOIS AVERITT PVX:[id number] DOB: Jan 25, 1950 DOA: 2014-01-09  PCP: Ellsworth Lennox   Admit date: 01-09-14 Discharge date: 01/04/2014  Recommendations for Outpatient Follow-up:  1. Pt will need to follow up with PCP in 2-3 weeks post discharge 2. Please obtain BMP to evaluate electrolytes and kidney function 3. Please also check CBC to evaluate Hg and Hct levels 4. Please note that neurologist recommended meclizine and valium for vertigo with close follow up with PCP  Discharge Diagnoses:  Principal Problem:   Vertigo Active Problems:   Morbid obesity   Vertigo, benign positional   Nausea with vomiting   Discharge Condition: Stable  Diet recommendation: Heart healthy diet discussed in details   History of present illness:  63 y.o. female with a history of episodes of Vertigo in the past who presented to the ED with complaints of severe dizziness, causing nausea and vomiting when she turns her head for the past 4-5 days. She denies any URI symptoms and symptoms of Headache, Earache or fever or chills. She also denies any history of Hearing problems. She was seen at the [facility] in the afternoon and was referred to the ED for an MRI of the Brain and admission. An MRI of the Brain was done and was negative for findings of posterior circulation pathology.   Hospital Course:  Principal Problem:   Vertigo - seen by neurologist - started valium and meclizine - pt reports feeling better and insisting on going home today  - vestibular therapy recommended outpatient  Active Problems:   Morbid obesity, BMI > 40  Procedures/Studies: Mr Kizzie Fantasia Contrast  01-09-14   Unremarkable appearance of the brain.  Consultations:  Neurology  Discharge Exam: Filed Vitals:   01/04/14 1034  BP: 117/55  Pulse: 82  Temp: 99 F (37.2 C)  Resp: 18   Filed Vitals:   01/03/14 2220 01/04/14 0255 01/04/14 0534 01/04/14 1034  BP: 149/120  101/50 126/69 117/55  Pulse: 97 76 74 82  Temp:   98.7 F (37.1 C) 99 F (37.2 C)  TempSrc:   Oral Oral  Resp: 18 19 18 18   Height:      Weight:      SpO2:   93% 94%    General: Pt is alert, follows commands appropriately, not in acute distress Cardiovascular: Regular rate and rhythm, S1/S2 +, no murmurs, no rubs, no gallops Respiratory: Clear to auscultation bilaterally, no wheezing, no crackles, no rhonchi Abdominal: Soft, non tender, non distended, bowel sounds +, no guarding Extremities: no edema, no cyanosis, pulses palpable bilaterally DP and PT Neuro: Grossly nonfocal  Discharge Instructions     Medication List    TAKE these medications        acetaminophen 500 MG tablet  Commonly known as:  TYLENOL  Take 1,000 mg by mouth every 6 (six) hours as needed for moderate pain (pain).     diazepam 5 MG tablet  Commonly known as:  VALIUM  Take one every 6 hours as needed for dizziness     LUNG TONIC Caps  Take 1 capsule by mouth daily.     meclizine 25 MG tablet  Commonly known as:  ANTIVERT  Take one or 2 tablets every 6 or 8 hours as needed for dizziness     MSM 1000 MG Tabs  Take 1 tablet by mouth 3 (three) times daily.     MULTIVITAMIN & MINERAL PO  Take 1 tablet by mouth daily.  ondansetron 4 MG disintegrating tablet  Commonly known as:  ZOFRAN ODT  Take one every 6 hours as needed for nausea or vomiting. May be dissolved in mouth.     promethazine 25 MG suppository  Commonly known as:  PHENERGAN  Place 1 suppository (25 mg total) rectally every 6 (six) hours as needed for nausea or vomiting.     Vitamin D3 2000 UNITS capsule  Take 2,000 Units by mouth daily.           Follow-up Information    Follow up with Ellsworth Lennox, MD.   Specialty:  Family Medicine   Contact information:   [place] [address] [phone]       Follow up with Melida Quitter, MD.   Specialty:  Otolaryngology   Contact information:   [address] [place] [address] [place] [address] [phone]       Follow up with Faye Ramsay, MD.   Specialty:  Internal Medicine   Why:  As needed, If symptoms worsen   Contact information:   [address] [place] [address] [phone]        The results of significant diagnostics from this hospitalization (including imaging, microbiology, ancillary and laboratory) are listed below for reference.     Microbiology: No results found for this or any previous visit (from the past 240 hour(s)).   Labs: Basic Metabolic Panel:  Recent Labs Lab 01/02/14 1146 01/02/14 1442 01/03/14 0530 01/04/14 0440  NA 141 146 140 143  K 4.1 3.9 4.3 3.9  CL 104 107 103 107  CO2 25 26 26 25   GLUCOSE 112* 88 102* 95  BUN 14 13 12 10   CREATININE 0.94 0.88 0.84 0.87  CALCIUM 9.5 9.1 8.8 9.0   Liver Function Tests:  Recent Labs Lab 01/02/14 1146 01/02/14 1442  AST 40* 39*  ALT 55* 56*  ALKPHOS 58 60  BILITOT 0.9 0.8  PROT 7.0 6.8  ALBUMIN 4.3 3.7   No results for input(s): LIPASE, AMYLASE in the last 168 hours. No results for input(s): AMMONIA in the last 168 hours. CBC:  Recent Labs Lab 01/02/14 1154 01/03/14 0530 01/04/14 0440  WBC 7.7 8.3 8.1  HGB 14.1 13.3 12.3  HCT 43.0 40.4 38.2  MCV 98.5* 99.5 99.2  PLT  --  228 216   Cardiac Enzymes: No results for input(s): CKTOTAL, CKMB, CKMBINDEX, TROPONINI in the last 168 hours. BNP: BNP (last 3 results) No results for input(s): PROBNP in the last 8760 hours. CBG: No results for input(s): GLUCAP in the last 168 hours.   SIGNED: Time coordinating discharge: Over 30 minutes  Faye Ramsay, MD  [facility] 01/04/2014, 12:19 PM Pager [phone]  If 7PM-7AM, please contact night-coverage [email or web address] Password [email or web address]

## 2014-01-04 NOTE — Evaluation (Signed)
Physical Therapy Evaluation Patient Details Name: Robin Tucker MRN: [id number] DOB: [birth year] Today's Date: 01/04/2014   History of Present Illness  63year old woman with prior history of vertigo presenting for acute onset vertigo. Per neurologist note: "Based on description her symptoms appear peripheral in nature. MRI brain reviewed and overall unremarkable, no signs of ischemic infarct"  Clinical Impression  Pt admitted with above diagnosis. Pt currently with functional limitations due to the deficits listed below (see PT Problem List).  Pt will benefit from skilled PT to increase their independence and safety with mobility to allow discharge to the venue listed below.  Pt wishes to d/c home.  Discussed safety of home set up and pt declines BSC due to space at this time.  Pt reports she has had similar episode of vertigo in the past which lasted a month and she was able to function at home however she was mostly in bed or on the couch.  Pt states her sister will be coming to assist her, also has a son at home however he works days.  Vestibular Exam: Pt reports dizziness started 5 days ago with sudden onset after awakening from sleep.  Pt reports spinning sensation worse with head movement and mobility and can last hours.  Pt also reports dizziness can occur even laying in supine.  Pt denies falls with onset however reports she did fall during previous vertigo episode stating she was standing and then all of a sudden on the floor and does not recall sensation of falling.  Pt with no spontaneous nystagmus, no gaze holding nystagmus, normal smooth pursuits, normal saccades however dizziness with head shaking, VOR cancellation.  Performed R and L dix hallpike and R and L supine head roll and pt denies dizziness and negative for nystagmus with all testing.  Discussed a couple compensation strategies for mobility such as focusing on stationary object and keeping head still as much as possible.   Recommended pt f/u with outpatient vestibular rehab however she does not appear interested at this time.  Will provide a few vestibular exercises and referral handout prior to d/c.     Follow Up Recommendations Outpatient PT (would best benefit from outpatient vestibular PT)    Equipment Recommendations  3in1 (PT) (pt declines due to small living space)    Recommendations for Other Services       Precautions / Restrictions Precautions Precautions: Fall      Mobility  Bed Mobility Overal bed mobility: Needs Assistance Bed Mobility: Supine to Sit;Sit to Supine     Supine to sit: Min assist Sit to supine: Min assist      Transfers Overall transfer level: Needs assistance Equipment used: Rolling walker (2 wheeled) Transfers: Sit to/from Stand Sit to Stand: Min assist         General transfer comment: assist to rise and steady, immediate circular trunk swaying upon standing improved within 30 sec however pt still felt like room was spinning, able to march in place approx 15 sec then needed to sit down due to dizziness  Ambulation/Gait Ambulation/Gait assistance:  (unable at this time)              Stairs            Wheelchair Mobility    Modified Rankin (Stroke Patients Only)       Balance  Pertinent Vitals/Pain Pain Assessment: No/denies pain    Home Living Family/patient expects to be discharged to:: Private residence Living Arrangements: Children Available Help at Discharge: Family (sister coming to assist upon d/c, son at home works) Type of Home: House Home Access: Stairs to enter     Home Layout: One level [place]: Environmental consultant - 2 wheels      Prior Function Level of Independence: Independent               Hand Dominance        Extremity/Trunk Assessment   Upper Extremity Assessment: Overall WFL for tasks assessed           Lower Extremity Assessment:  Overall WFL for tasks assessed      Cervical / Trunk Assessment: Normal  Communication   Communication: No difficulties  Cognition Arousal/Alertness: Awake/alert Behavior During Therapy: WFL for tasks assessed/performed Overall Cognitive Status: Within Functional Limits for tasks assessed                      General Comments      Exercises        Assessment/Plan    PT Assessment Patient needs continued PT services  PT Diagnosis Difficulty walking;Other (comment) (vertigo)   PT Problem List Decreased balance;Decreased mobility (vertigo)  PT Treatment Interventions DME instruction;Gait training;Stair training;Functional mobility training;Therapeutic activities;Therapeutic exercise;Patient/family education;Balance training;Neuromuscular re-education (habituation exercises, compensation techniques, visual exercises)   PT Goals (Current goals can be found in the Care Plan section) Acute Rehab PT Goals PT Goal Formulation: With patient/family Time For Goal Achievement: 01/11/14 Potential to Achieve Goals: Fair    Frequency Min 4X/week   Barriers to discharge        Co-evaluation               End of Session Equipment Utilized During Treatment: Gait belt Activity Tolerance: Other (comment) (limited by dizziness) Patient left: in bed;with call bell/phone within reach;with family/visitor present           Time: 1212-1300 PT Time Calculation (min) (ACUTE ONLY): 48 min   Charges:   PT Evaluation $Initial PT Evaluation Tier I: 1 Procedure PT Treatments $Therapeutic Activity: 38-52 mins   PT G Codes:          Robin Tucker,Robin Tucker 01/04/2014, 1:33 PM Carmelia Bake, PT, DPT 01/04/2014 Pager: [phone]

## 2014-01-05 ENCOUNTER — Telehealth: Payer: Self-pay

## 2014-01-05 ENCOUNTER — Other Ambulatory Visit: Payer: Self-pay

## 2014-01-05 DIAGNOSIS — R42 Dizziness and giddiness: Secondary | ICD-10-CM

## 2014-01-05 NOTE — Telephone Encounter (Signed)
PATIENT'S SON (DANIEL) STATES HIS MOTHER CAME IN THE OFFICE AND SAW DR. Elder Cyphers ON SAT. FOR NAUSEA & VOMITING. SHE WAS SENT TO THE EMERGENCY ROOM BY EMS. THEY DID A BUNCH OF TEST ON HER THERE. SHE WAS RELEASED AND THEY SUGGESTED THAT DR. Elder Cyphers REFER HER TO AN ENT SPECIALIST. THEY SUGGESTED DR. DWIGHT BATES. HE WOULD LIKE TO SEE IF THAT REFERRAL CAN BE MADE FOR HIS MOTHER.  BEST PHONE [phone] ([place]) OR [phone] [facility]) OR [phone] ([place])   [place]

## 2014-01-05 NOTE — Telephone Encounter (Signed)
Ok to refer to dr. Redmond Baseman

## 2014-01-05 NOTE — Telephone Encounter (Signed)
Dr. Elder Cyphers,  I read through your note. Is it okay to put in the ENT referral?

## 2014-01-05 NOTE — Telephone Encounter (Signed)
Placed referral to Dr. Redmond Baseman. Spoke to pt and informed her we put the the referral in.

## 2014-01-22 HISTORY — PX: COLONOSCOPY: SHX174

## 2014-09-03 ENCOUNTER — Ambulatory Visit: Payer: BLUE CROSS/BLUE SHIELD | Admitting: Adult Health

## 2014-09-10 ENCOUNTER — Encounter: Payer: Self-pay | Admitting: Adult Health

## 2014-09-10 ENCOUNTER — Ambulatory Visit (INDEPENDENT_AMBULATORY_CARE_PROVIDER_SITE_OTHER): Payer: 59 | Admitting: Adult Health

## 2014-09-10 VITALS — BP 138/80 | HR 91 | Temp 98.3°F

## 2014-09-10 DIAGNOSIS — Z7189 Other specified counseling: Secondary | ICD-10-CM | POA: Diagnosis not present

## 2014-09-10 DIAGNOSIS — Z7689 Persons encountering health services in other specified circumstances: Secondary | ICD-10-CM

## 2014-09-10 DIAGNOSIS — R42 Dizziness and giddiness: Secondary | ICD-10-CM

## 2014-09-10 MED ORDER — DIAZEPAM 5 MG PO TABS: ORAL_TABLET | ORAL | Status: DC

## 2014-09-10 NOTE — Patient Instructions (Signed)
It was great meeting you today!   Follow up with me as soon as you can for a physical   Someone from neurology will call you to set up an appointment.   Let me know if you need anything in the meantime.

## 2014-09-10 NOTE — Progress Notes (Signed)
HPI:  Robin Tucker is here to establish care.  Last PCP and physical:"15-20 years ago"   Immunizations:Needs tetanus but does not want one Diet: Is eating poorly, she does not cook a lot.  Exercise: Does not exercise since vertigo episodes. Colonoscopy:2001 Mammogram:2001 Has the following chronic problems that require follow up and concerns today:  Vertigo  - This has been an ongoing issue for the last year and reports " I am always dizzy" She does have "spells for weeks".  She has been in the hospital and worked up several times in the last year (last time being in December 2015), including a MRI on 01/02/2014 IMPRESSION: Unremarkable appearance of the brain. She has been seen by Neurology while in the hospital- See notes from consult on 01/04/2014.  She has also been seen by her an audiology and she reports that has come back negative for any findings.   She takes Meclizine and valium as needed to help with her symptoms.   In the office today she is unable to get out of wheel chair during exam due to her vertigo.   Depression  - This is not well controlled, she endorses crying multiple times per day , she has not taken any medication in the last "20 years", and does not want to take any medication to help with her depression.    ROS negative for unless reported above: fevers, chills,feeling poorly, unintentional weight loss, hearing or vision loss, chest pain, palpitations, leg claudication, struggling to breath,Not feeling congested in the chest, no orthopenia, no cough,no wheezing, normal appetite, no soft tissue swelling, no hemoptysis, melena, hematochezia, hematuria, falls, loc, si, or thoughts of self harm.    Past Medical History  Diagnosis Date  . Vertigo   . Anxiety   . Depression     Past Surgical History  Procedure Laterality Date  . Cesarean section      per pt 4    Family History  Problem Relation Age of Onset  . Heart disease Mother   . Leukemia  Father   . Colon cancer Mother     Social History   Social History  . Marital Status: Single    Spouse Name: N/A  . Number of Children: N/A  . Years of Education: N/A   Social History Main Topics  . Smoking status: Never Smoker   . Smokeless tobacco: None  . Alcohol Use: No  . Drug Use: No  . Sexual Activity: No   Other Topics Concern  . None   Social History Narrative     Current outpatient prescriptions:  .  acetaminophen (TYLENOL) 500 MG tablet, Take 1,000 mg by mouth every 6 (six) hours as needed for moderate pain (pain)., Disp: , Rfl:  .  diazepam (VALIUM) 5 MG tablet, Take one every 6 hours as needed for dizziness, Disp: 120 tablet, Rfl: 0 .  meclizine (ANTIVERT) 25 MG tablet, Take one or 2 tablets every 6 or 8 hours as needed for dizziness, Disp: 60 tablet, Rfl: 1 .  Methylsulfonylmethane (MSM) 1000 MG TABS, Take 1 tablet by mouth 3 (three) times daily., Disp: , Rfl:  .  Multiple Vitamins-Minerals (MULTIVITAMIN & MINERAL PO), Take 1 tablet by mouth daily., Disp: , Rfl:  .  promethazine (PHENERGAN) 25 MG tablet, Take 1 tablet (25 mg total) by mouth every 6 (six) hours as needed for nausea or vomiting., Disp: 65 tablet, Rfl: 0 .  Cholecalciferol (VITAMIN D3) 2000 UNITS capsule, Take 2,000 Units by  mouth daily., Disp: , Rfl:   EXAM:  Filed Vitals:   09/10/14 1423  BP: 138/80  Pulse: 91  Temp: 98.3 F (36.8 C)    There is no weight on file to calculate BMI.  GENERAL: vitals reviewed and listed above, alert, oriented, appears well hydrated and in no acute distress. She is obese.  HEENT: atraumatic, conjunttiva clear, no obvious abnormalities on inspection of external nose and ears. TM's visualized, no signs of infection or trauma.   NECK: Neck is soft and supple without masses, no adenopathy or thyromegaly, trachea midline, no JVD. Normal range of motion.   LUNGS: clear to auscultation bilaterally, no wheezes, rales or rhonchi, good air movement  CV: Regular  rate and rhythm, normal S1/S2, no audible murmurs, gallops, or rubs. No carotid bruit. Trace edema in bilateral lower extremities.   MS: Unknown as she was unable get out of wheel chair. She is able to move her upper extremities without difficulty  Abd: soft/nontender/nondistended/normal bowel sounds   Skin: warm and dry, no rash   Extremities: No clubbing, cyanosis, or edema. Capillary refill is WNL. Pulses intact bilaterally in upper and lower extremities.   Neuro: CN II-XII intact, sensation and reflexes normal throughout, 5/5 muscle strength in bilateral upper and lower extremities. Normal finger to nose. Normal rapid alternating movements.   PSYCH: pleasant and cooperative, no obvious anxiety, + depression, became tearful during exam.  ASSESSMENT AND PLAN:  1. Encounter to establish care - Ambulatory referral to Neurology - Follow up in one month for CPE - Follow up sooner if needed.   2. Vertigo - Unknown etiology at this point.  - Ambulatory referral to Neurology - diazepam (VALIUM) 5 MG tablet; Take one every 6 hours as needed for dizziness  Dispense: 120 tablet; Refill: 0 - Discussed starting antidepressant as this could potentially help with her dizziness as well. She was unwilling to start medication, states " I want to see what Neurology has to say first, I don't want to be masking the symptoms" - Continue with current medication therapy until seen by neurology.    -We reviewed the PMH, PSH, FH, SH, Meds and Allergies. -We provided refills for any medications we will prescribe as needed. -We addressed current concerns per orders and patient instructions. -We have asked for records for pertinent exams, studies, vaccines and notes from previous providers. -We have advised patient to follow up per instructions below.   -Patient advised to return or notify a provider immediately if symptoms worsen or persist or new concerns arise.    Dorothyann Peng, AGNP

## 2014-09-13 ENCOUNTER — Ambulatory Visit (INDEPENDENT_AMBULATORY_CARE_PROVIDER_SITE_OTHER): Payer: 59 | Admitting: Adult Health

## 2014-09-13 ENCOUNTER — Encounter: Payer: Self-pay | Admitting: Adult Health

## 2014-09-13 VITALS — BP 110/72 | Temp 98.4°F | Ht 64.0 in | Wt 233.5 lb

## 2014-09-13 DIAGNOSIS — Z0000 Encounter for general adult medical examination without abnormal findings: Secondary | ICD-10-CM

## 2014-09-13 DIAGNOSIS — Z23 Encounter for immunization: Secondary | ICD-10-CM | POA: Diagnosis not present

## 2014-09-13 DIAGNOSIS — R42 Dizziness and giddiness: Secondary | ICD-10-CM | POA: Diagnosis not present

## 2014-09-13 LAB — CBC WITH DIFFERENTIAL/PLATELET
BASOS ABS: 0.1 10*3/uL (ref 0.0–0.1)
Basophils Relative: 0.6 % (ref 0.0–3.0)
EOS ABS: 0.4 10*3/uL (ref 0.0–0.7)
Eosinophils Relative: 4.2 % (ref 0.0–5.0)
HCT: 41.8 % (ref 36.0–46.0)
Hemoglobin: 14.2 g/dL (ref 12.0–15.0)
LYMPHS ABS: 2.6 10*3/uL (ref 0.7–4.0)
LYMPHS PCT: 30.1 % (ref 12.0–46.0)
MCHC: 33.9 g/dL (ref 30.0–36.0)
MCV: 97.5 fl (ref 78.0–100.0)
Monocytes Absolute: 0.6 10*3/uL (ref 0.1–1.0)
Monocytes Relative: 6.8 % (ref 3.0–12.0)
NEUTROS ABS: 5 10*3/uL (ref 1.4–7.7)
NEUTROS PCT: 58.3 % (ref 43.0–77.0)
PLATELETS: 245 10*3/uL (ref 150.0–400.0)
RBC: 4.28 Mil/uL (ref 3.87–5.11)
RDW: 13.5 % (ref 11.5–15.5)
WBC: 8.6 10*3/uL (ref 4.0–10.5)

## 2014-09-13 LAB — POCT URINALYSIS DIPSTICK
BILIRUBIN UA: NEGATIVE
Glucose, UA: NEGATIVE
Ketones, UA: NEGATIVE
Leukocytes, UA: NEGATIVE
Nitrite, UA: NEGATIVE
PH UA: 6
PROTEIN UA: NEGATIVE
RBC UA: NEGATIVE
SPEC GRAV UA: 1.015
Urobilinogen, UA: 0.2

## 2014-09-13 LAB — HEPATIC FUNCTION PANEL
ALK PHOS: 73 U/L (ref 39–117)
ALT: 52 U/L — AB (ref 0–35)
AST: 31 U/L (ref 0–37)
Albumin: 4.1 g/dL (ref 3.5–5.2)
Bilirubin, Direct: 0.1 mg/dL (ref 0.0–0.3)
TOTAL PROTEIN: 6.8 g/dL (ref 6.0–8.3)
Total Bilirubin: 0.9 mg/dL (ref 0.2–1.2)

## 2014-09-13 LAB — BASIC METABOLIC PANEL
BUN: 11 mg/dL (ref 6–23)
CHLORIDE: 104 meq/L (ref 96–112)
CO2: 29 meq/L (ref 19–32)
CREATININE: 0.77 mg/dL (ref 0.40–1.20)
Calcium: 9.7 mg/dL (ref 8.4–10.5)
GFR: 80.55 mL/min (ref >60.00)
GLUCOSE: 127 mg/dL — AB (ref 70–99)
POTASSIUM: 4.3 meq/L (ref 3.5–5.1)
Sodium: 138 mEq/L (ref 135–145)

## 2014-09-13 LAB — TSH: TSH: 4.79 u[IU]/mL — ABNORMAL HIGH (ref 0.35–4.50)

## 2014-09-13 LAB — HEMOGLOBIN A1C: HEMOGLOBIN A1C: 6.7 % — AB (ref 4.6–6.5)

## 2014-09-13 NOTE — Patient Instructions (Signed)
It was great seeing you again!   I will follow up with you regarding your blood work once I get it back.   Call neurology when you get home and make that appointment.   Someone from GI will call you to set up the colonoscopy.

## 2014-09-13 NOTE — Progress Notes (Signed)
Subjective:    Patient ID: Robin Tucker, female    DOB: November 08, 1950, 64 y.o.   MRN: [id number]  HPI 64 year old female who presents to the office today for her complete physical. She endorses that she has not had a physical done in the last "15-20 years".   She has not had a colonoscopy in the last 10 years nor has she had a mammogram. She does not want a mammogram due to not feeling like they are worthwhile.   Neurology has called this morning to schedule an appointment for her year long battle with Vertigo but she has not called them back.   She does not have a living will or advanced directives.   She is unable to exercise due to vertigo, which has caused her to gain weight. She is not following a specific diet.   She has no complaints that she would like to talk about today. She appears more comfortable than she was when I originally saw her 3 days ago. Per the patient she is having a " mild day of vertigo."   Review of Systems  Constitutional: Positive for appetite change and fatigue. Negative for unexpected weight change.  HENT: Negative.   Eyes: Negative.   Respiratory: Negative.   Cardiovascular: Negative.   Gastrointestinal: Negative.   Musculoskeletal: Negative.   Skin: Negative.   Neurological: Positive for dizziness, weakness and light-headedness. Negative for seizures, syncope, speech difficulty, numbness and headaches.  Hematological: Negative.   Psychiatric/Behavioral: Negative.   All other systems reviewed and are negative.  Past Medical History  Diagnosis Date  . Vertigo   . Anxiety   . Depression     Social History   Social History  . Marital Status: Single    Spouse Name: N/A  . Number of Children: N/A  . Years of Education: N/A   Occupational History  . Not on file.   Social History Main Topics  . Smoking status: Never Smoker   . Smokeless tobacco: Not on file  . Alcohol Use: No  . Drug Use: No  . Sexual Activity: No   Other Topics  Concern  . Not on file   Social History Narrative   Has 4 children    One child lives with her.    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 Cesarean section      per pt 4  . Ablation on endometriosis      Family History  Problem Relation Age of Onset  . Heart disease Mother   . Leukemia Father   . Colon cancer Mother     Allergies  Allergen Reactions  . Codeine Hives  . Naproxen Hives    Current Outpatient Prescriptions on File Prior to Visit  Medication Sig Dispense Refill  . acetaminophen (TYLENOL) 500 MG tablet Take 1,000 mg by mouth every 6 (six) hours as needed for moderate pain (pain).    . Cholecalciferol (VITAMIN D3) 2000 UNITS capsule Take 2,000 Units by mouth daily.    . diazepam (VALIUM) 5 MG tablet Take one every 6 hours as needed for dizziness 120 tablet 0  . meclizine (ANTIVERT) 25 MG tablet Take one or 2 tablets every 6 or 8 hours as needed for dizziness 60 tablet 1  . Methylsulfonylmethane (MSM) 1000 MG TABS Take 1 tablet by mouth 3 (three) times daily.    . Multiple Vitamins-Minerals (MULTIVITAMIN &  MINERAL PO) Take 1 tablet by mouth daily.    . promethazine (PHENERGAN) 25 MG tablet Take 1 tablet (25 mg total) by mouth every 6 (six) hours as needed for nausea or vomiting. 65 tablet 0   No current facility-administered medications on file prior to visit.    BP 110/72 mmHg  Temp(Src) 98.4 F (36.9 C) (Oral)  Ht 5\' 4"  (1.626 m)  Wt 233 lb 8 oz (105.915 kg)  BMI 40.06 kg/m2       Objective:   Physical Exam  Constitutional: She is oriented to person, place, and time. She appears well-developed and well-nourished. No distress.  obese  HENT:  Head: Normocephalic and atraumatic.  Right Ear: External ear normal.  Left Ear: External ear normal.  Nose: Nose normal.  Mouth/Throat: Oropharynx is clear and moist. No oropharyngeal exudate.  Eyes: Conjunctivae are normal. Pupils are equal,  round, and reactive to light. Right eye exhibits no discharge. Left eye exhibits no discharge.  Neck: Normal range of motion. Neck supple. No tracheal deviation present. No thyromegaly present.  Cardiovascular: Normal rate, regular rhythm, normal heart sounds and intact distal pulses.  Exam reveals no gallop and no friction rub.   No murmur heard. Pulmonary/Chest: Effort normal and breath sounds normal. No respiratory distress. She has no wheezes. She has no rales. She exhibits no tenderness.  Abdominal: Soft. Bowel sounds are normal. She exhibits no distension and no mass. There is no tenderness. There is no rebound and no guarding.  Genitourinary:  Refused Breast Exam   Musculoskeletal: Normal range of motion.  Lymphadenopathy:    She has no cervical adenopathy.  Neurological: She is alert and oriented to person, place, and time. No cranial nerve deficit. Coordination normal.  Skin: Skin is warm and dry. No rash noted. She is not diaphoretic. No erythema. No pallor.  Psychiatric: She has a normal mood and affect. Her behavior is normal. Judgment and thought content normal.  Nursing note and vitals reviewed.     Assessment & Plan:  1. Routine general medical examination at a health care facility - Basic metabolic panel - Hemoglobin A1c - Hepatic function panel - POCT urinalysis dipstick - Ambulatory referral to Gastroenterology - TSH - CBC with Differential - Tdap vaccine greater than or equal to 7yo IM - Stressed importance of yearly mammograms- she continued to refuse.  - Exercise as much as possible.  - Follow up in 6 months or sooner if needed.   2. Need for prophylactic vaccination with combined diphtheria-tetanus-pertussis (DTP) vaccine - Tdap vaccine greater than or equal to 7yo IM  3. Vertigo - Basic metabolic panel - Hemoglobin A1c - Hepatic function panel - POCT urinalysis dipstik - TSH - CBC with Differential - Follow up with Neurology as directed - Continue with  current medication regimen for vertigo.

## 2014-09-14 ENCOUNTER — Other Ambulatory Visit (INDEPENDENT_AMBULATORY_CARE_PROVIDER_SITE_OTHER): Payer: 59

## 2014-09-14 DIAGNOSIS — E039 Hypothyroidism, unspecified: Secondary | ICD-10-CM | POA: Diagnosis not present

## 2014-09-14 DIAGNOSIS — I519 Heart disease, unspecified: Principal | ICD-10-CM

## 2014-09-14 LAB — T4, FREE: FREE T4: 0.66 ng/dL (ref 0.60–1.60)

## 2014-09-14 LAB — T3, FREE: T3, Free: 3.1 pg/mL (ref 2.3–4.2)

## 2014-09-15 ENCOUNTER — Telehealth: Payer: Self-pay | Admitting: Adult Health

## 2014-09-15 NOTE — Telephone Encounter (Signed)
Left VM to call back regarding labs  

## 2014-09-16 ENCOUNTER — Telehealth: Payer: Self-pay | Admitting: Adult Health

## 2014-09-16 NOTE — Telephone Encounter (Signed)
Spoke to patient on the phone and informed her of her A1c. Advised her that she really needs to concentrate on diabetic diet since exercise is difficult due to her vertigo. She refused diabetes education

## 2014-09-21 ENCOUNTER — Ambulatory Visit (INDEPENDENT_AMBULATORY_CARE_PROVIDER_SITE_OTHER): Payer: 59 | Admitting: Neurology

## 2014-09-21 ENCOUNTER — Encounter: Payer: Self-pay | Admitting: Neurology

## 2014-09-21 VITALS — BP 130/80 | HR 96 | Ht 64.0 in | Wt 233.0 lb

## 2014-09-21 DIAGNOSIS — R42 Dizziness and giddiness: Secondary | ICD-10-CM | POA: Diagnosis not present

## 2014-09-21 NOTE — Patient Instructions (Signed)
We will get MRI of brain with and without contrast and MRA of head If unremarkable, would refer for vestibular rehabilitation

## 2014-09-21 NOTE — Progress Notes (Addendum)
NEUROLOGY CONSULTATION NOTE  Robin Tucker MRN: [id number] DOB: Jun 17, 1950  Referring provider: Dorothyann Peng, NP Primary care provider: Dorothyann Peng, NP  Reason for consult:  Dizziness, vertigo  HISTORY OF PRESENT ILLNESS: Robin Tucker is a 64 year old right-handed female who presents for longstanding vertigo and disequilibrium.  History obtained from patient, hospital notes from December and recent PCP notes.  Imaging of brain MRI from December and labs reviewed.  On 08/25/14, she rolled over in bed in the middle of the night and developed sudden onset of spinning sensation with nausea and vomiting.  Any slight movement exacerbated the symptoms.  This was ongoing for 3 weeks.  Afterwards, it gradually improved although did not completely resolve.  She always felt unsteady and dizzy.  Movement and change in head position, such as looking up or down, exacerbates symptoms.  She had a recurrent episode in December 2015.  She was admitted to [facility] for further evaluation.  MRI of the brain with and without contrast was unremarkable, showing no acute/subacute stroke or hemorrhage, significant white matter abnormalities, or mass lesion.  Testing for orthostatic hypotension was negative.  She has since had ENT evaluation, which testing was unremarkable.  She had another severe episode in March or April.  However, she is always dizzy and unsteady on her feet.  He has not had a fall.  She denies visual changes.  She denies hearing loss, tinnitus, aural fullness, slurred speech or focal numbness or weakness.  She reports some head pressure but denies headache.  Meclizine,phenergan and valium were ineffective.  She had an episode of vertigo in college which subsequently resolved.  She has had only one migraine in her entire life.  There is no family history of migraine.  Recent labs showed Hgb A1c of 6.7 and mildly elevated TSH of 4.79.  Free T3 and T4 were normal.  PAST MEDICAL  HISTORY: Past Medical History  Diagnosis Date  . Vertigo   . Anxiety   . Depression     PAST SURGICAL HISTORY: Past Surgical History  Procedure Laterality Date  . Cesarean section      per pt 4  . Ablation on endometriosis      MEDICATIONS: Current Outpatient Prescriptions on File Prior to Visit  Medication Sig Dispense Refill  . acetaminophen (TYLENOL) 500 MG tablet Take 1,000 mg by mouth every 6 (six) hours as needed for moderate pain (pain).    . Cholecalciferol (VITAMIN D3) 2000 UNITS capsule Take 2,000 Units by mouth daily.    . diazepam (VALIUM) 5 MG tablet Take one every 6 hours as needed for dizziness 120 tablet 0  . meclizine (ANTIVERT) 25 MG tablet Take one or 2 tablets every 6 or 8 hours as needed for dizziness 60 tablet 1  . Methylsulfonylmethane (MSM) 1000 MG TABS Take 1 tablet by mouth 3 (three) times daily.    . Multiple Vitamins-Minerals (MULTIVITAMIN & MINERAL PO) Take 1 tablet by mouth daily.    . promethazine (PHENERGAN) 25 MG tablet Take 1 tablet (25 mg total) by mouth every 6 (six) hours as needed for nausea or vomiting. 65 tablet 0   No current facility-administered medications on file prior to visit.    ALLERGIES: Allergies  Allergen Reactions  . Codeine Hives  . Naproxen Hives    FAMILY HISTORY: Family History  Problem Relation Age of Onset  . Heart disease Mother   . Leukemia Father   . Colon cancer Mother   . Colon  cancer Father     SOCIAL HISTORY: Social History   Social History  . Marital Status: Single    Spouse Name: N/A  . Number of Children: N/A  . Years of Education: N/A   Occupational History  . Not on file.   Social History Main Topics  . Smoking status: Never Smoker   . Smokeless tobacco: Not on file  . Alcohol Use: 0.0 oz/week    0 Standard drinks or equivalent per week     Comment: a glass of wine a week  . Drug Use: No  . Sexual Activity: No   Other Topics Concern  . Not on file   Social History Narrative    Has 4 children    One child lives with her.    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    REVIEW OF SYSTEMS: Constitutional: No fevers, chills, or sweats, no generalized fatigue, change in appetite Eyes: No visual changes, double vision, eye pain Ear, nose and throat: No hearing loss, ear pain, nasal congestion, sore throat Cardiovascular: No chest pain, palpitations Respiratory:  No shortness of breath at rest or with exertion, wheezes GastrointestinaI: No nausea, vomiting, diarrhea, abdominal pain, fecal incontinence Genitourinary:  No dysuria, urinary retention or frequency Musculoskeletal:  No neck pain, back pain Integumentary: No rash, pruritus, skin lesions Neurological: as above Psychiatric: No depression, insomnia, anxiety Endocrine: No palpitations, fatigue, diaphoresis, mood swings, change in appetite, change in weight, increased thirst Hematologic/Lymphatic:  No anemia, purpura, petechiae. Allergic/Immunologic: no itchy/runny eyes, nasal congestion, recent allergic reactions, rashes  PHYSICAL EXAM: Filed Vitals:   09/21/14 1245  BP: 130/80  Pulse: 96   General: No acute distress.   Head:  Normocephalic/atraumatic Eyes:  fundi unremarkable, without vessel changes, exudates, hemorrhages or papilledema. Neck: supple, no paraspinal tenderness, full range of motion Back: No paraspinal tenderness Heart: regular rate and rhythm Lungs: Clear to auscultation bilaterally. Vascular: No carotid bruits. Neurological Exam: Mental status: alert and oriented to person, place, and time, recent and remote memory intact, fund of knowledge intact, attention and concentration intact, speech fluent and not dysarthric, language intact. Cranial nerves: CN I: not tested CN II: pupils equal, round and reactive to light, visual fields intact, fundi unremarkable, without vessel changes, exudates, hemorrhages or papilledema. CN III, IV, VI:  full range of motion, no nystagmus, no  ptosis CN V: facial sensation intact CN VII: upper and lower face symmetric CN VIII: hearing intact CN IX, X: gag intact, uvula midline CN XI: sternocleidomastoid and trapezius muscles intact CN XII: tongue midline Bulk & Tone: normal, no fasciculations. Motor:  5/5 throughout. Sensation:  Pinprick and vibration sensation intact. Deep Tendon Reflexes:  2+ throughout, toes downgoing.  Finger to nose testing:  Without dysmetria.  Heel to shin:  Without dysmetria.  Gait:  Ataxic.  Unable to tandem walk. Romberg positive.  IMPRESSION: Vertigo/disequilbrium.  Etiology unclear.     PLAN: 1.  Since she has significant constant symptoms that affect her daily quality of life, I think that repeat MRI of the brain with and without contrast should be performed to look for anything new that has progressed and may have been missed on prior study.  I would also like to get MRA of the head to evaluate the vertebrobasilar system. 2.  If unremarkable, then I would refer to vestibular rehab 3.  Otherwise, it would be supportive care.  Unfortunately, I have no other suggestions other than meclizine or a benzodiazepine. 4.  Weight loss  Thank you for allowing me to take part in the care of this patient.  Metta Clines, DO  CC:  Dorothyann Peng, NP

## 2014-09-30 ENCOUNTER — Other Ambulatory Visit (HOSPITAL_COMMUNITY): Payer: 59

## 2014-09-30 ENCOUNTER — Ambulatory Visit (HOSPITAL_COMMUNITY)
Admission: RE | Admit: 2014-09-30 | Discharge: 2014-09-30 | Disposition: A | Discharge status: Home / Self Care | Payer: 59 | Source: Ambulatory Visit | Attending: Neurology | Admitting: Neurology

## 2014-09-30 ENCOUNTER — Encounter: Payer: Self-pay | Admitting: Gastroenterology

## 2014-09-30 DIAGNOSIS — R42 Dizziness and giddiness: Secondary | ICD-10-CM | POA: Insufficient documentation

## 2014-09-30 MED ORDER — GADOBENATE DIMEGLUMINE 529 MG/ML IV SOLN
20.0000 mL | Freq: Once | INTRAVENOUS | Status: AC | PRN
  Administered 2014-09-30: 20 mL via INTRAVENOUS

## 2014-10-01 ENCOUNTER — Telehealth: Payer: Self-pay

## 2014-10-01 NOTE — Telephone Encounter (Signed)
-----   Message from Pieter Partridge, DO sent at 10/01/2014  7:08 AM EDT ----- MRI and MRA of the head shows no cause for the vertigo/dizziness.  I would just continue supportive care with her primary care provider

## 2014-10-01 NOTE — Telephone Encounter (Signed)
LM for patient to return call. Thanks. [facility]

## 2014-10-04 NOTE — Telephone Encounter (Signed)
Spoke with pt. Pt. Advised of results and will follow up as needed. Patient verbalized understanding of results. [facility]

## 2014-10-05 ENCOUNTER — Ambulatory Visit (INDEPENDENT_AMBULATORY_CARE_PROVIDER_SITE_OTHER): Payer: 59 | Admitting: Adult Health

## 2014-10-05 ENCOUNTER — Encounter: Payer: Self-pay | Admitting: Adult Health

## 2014-10-05 ENCOUNTER — Ambulatory Visit: Payer: BLUE CROSS/BLUE SHIELD | Admitting: Adult Health

## 2014-10-05 VITALS — BP 128/78 | Ht 64.0 in | Wt 233.9 lb

## 2014-10-05 DIAGNOSIS — R42 Dizziness and giddiness: Secondary | ICD-10-CM | POA: Diagnosis not present

## 2014-10-05 DIAGNOSIS — E781 Pure hyperglyceridemia: Secondary | ICD-10-CM | POA: Diagnosis not present

## 2014-10-05 NOTE — Progress Notes (Signed)
Pre visit review using our clinic review tool, if applicable. No additional management support is needed unless otherwise documented below in the visit note. 

## 2014-10-05 NOTE — Progress Notes (Signed)
Subjective:    Patient ID: Robin Tucker, female    DOB: [birth year], 64 y.o.   MRN: [id number]  HPI 64 year old female who presents to the office today for follow up regarding her Vertigo and DM II. Since her last visit she has been to Dr. Loretta Plume at Neurology and had MRI/MRA of brain done. Dr. Tomi Likens was unable to find exact cause of her vertigo and suggested vestibular rehab. She endorses that today she feels much better and she has been feeling better for the last week.   She is also concerned about her recent diagnosis of diabetes. Her most recent A1c was 6.7. She has been exercising more and has started to watch her diet.   Review of Systems  Constitutional: Negative.   Respiratory: Negative.   Endocrine: Negative.   Genitourinary: Negative.   Neurological: Positive for dizziness. Negative for weakness and light-headedness.  Psychiatric/Behavioral: Negative.   All other systems reviewed and are negative.  Past Medical History  Diagnosis Date  . Vertigo   . Anxiety   . Depression     Social History   Social History  . Marital Status: Single    Spouse Name: N/A  . Number of Children: N/A  . Years of Education: N/A   Occupational History  . Not on file.   Social History Main Topics  . Smoking status: Never Smoker   . Smokeless tobacco: Not on file  . Alcohol Use: 0.0 oz/week    0 Standard drinks or equivalent per week     Comment: a glass of wine a week  . Drug Use: No  . Sexual Activity: No   Other Topics Concern  . Not on file   Social History Narrative   Has 4 children    One child lives with her.    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 Cesarean section      per pt 4  . Ablation on endometriosis      Family History  Problem Relation Age of Onset  . Heart disease Mother   . Leukemia Father   . Colon cancer Mother   . Colon cancer Father     Allergies  Allergen Reactions  .  Codeine Hives  . Naproxen Hives    Current Outpatient Prescriptions on File Prior to Visit  Medication Sig Dispense Refill  . acetaminophen (TYLENOL) 500 MG tablet Take 1,000 mg by mouth every 6 (six) hours as needed for moderate pain (pain).    . Cholecalciferol (VITAMIN D3) 2000 UNITS capsule Take 2,000 Units by mouth daily.    . diazepam (VALIUM) 5 MG tablet Take one every 6 hours as needed for dizziness 120 tablet 0  . meclizine (ANTIVERT) 25 MG tablet Take one or 2 tablets every 6 or 8 hours as needed for dizziness 60 tablet 1  . Methylsulfonylmethane (MSM) 1000 MG TABS Take 1 tablet by mouth 3 (three) times daily.    . Multiple Vitamins-Minerals (MULTIVITAMIN & MINERAL PO) Take 1 tablet by mouth daily.    . promethazine (PHENERGAN) 25 MG tablet Take 1 tablet (25 mg total) by mouth every 6 (six) hours as needed for nausea or vomiting. 65 tablet 0   No current facility-administered medications on file prior to visit.    BP 128/78 mmHg  Ht 5\' 4"  (1.626 m)  Wt 233 lb 14.4 oz (106.096 kg)  BMI 40.13  kg/m2       Objective:   Physical Exam  Constitutional: She is oriented to person, place, and time. She appears well-developed and well-nourished. No distress.  Eyes: Conjunctivae and EOM are normal. Pupils are equal, round, and reactive to light. Right eye exhibits no discharge. Left eye exhibits no discharge.  Cardiovascular: Normal rate, regular rhythm, normal heart sounds and intact distal pulses.  Exam reveals no gallop and no friction rub.   No murmur heard. Pulmonary/Chest: Effort normal and breath sounds normal. No respiratory distress. She has no wheezes. She has no rales. She exhibits no tenderness.  Neurological: She is alert and oriented to person, place, and time. She has normal reflexes.  Skin: Skin is warm and dry. No rash noted. She is not diaphoretic. No erythema. No pallor.  Psychiatric: She has a normal mood and affect. Her behavior is normal. Judgment and thought  content normal.  Nursing note and vitals reviewed.     Assessment & Plan:  1. Vertigo - Suggested [facility] for further evaluation of her vertigo  - Also suggested SSRI  - She declined both at this time. Per patient " I am feeling a little better, I just want to wait and see if I continue to get better."  2. HYPERGLYCERIDEMIA, PURE - Glucometer given to patient, she is to monitor twice a day and bring log with her.  - Educated on diabetic diet.  - Return in 2 months for A1c recheck

## 2014-10-05 NOTE — Patient Instructions (Addendum)
It was great seeing you again and I am glad you are feeling better.   Start monitoring your blood sugars at home . Bring the log with you to your next visit.   I would like your blood sugars between 70-120 and after meals, less than 140. Monitor it before each meal  Follow up in 2 months  Basic Carbohydrate Counting for Diabetes Mellitus Carbohydrate counting is a method for keeping track of the amount of carbohydrates you eat. Eating carbohydrates naturally increases the level of sugar (glucose) in your blood, so it is important for you to know the amount that is okay for you to have in every meal. Carbohydrate counting helps keep the level of glucose in your blood within normal limits. The amount of carbohydrates allowed is different for every person. A dietitian can help you calculate the amount that is right for you. Once you know the amount of carbohydrates you can have, you can count the carbohydrates in the foods you want to eat. Carbohydrates are found in the following foods:  Grains, such as breads and cereals.  Dried beans and soy products.  Starchy vegetables, such as potatoes, peas, and corn.  Fruit and fruit juices.  Milk and yogurt.  Sweets and snack foods, such as cake, cookies, candy, chips, soft drinks, and fruit drinks. CARBOHYDRATE COUNTING There are two ways to count the carbohydrates in your food. You can use either of the methods or a combination of both. Reading the "Nutrition Facts" on [place] The "Nutrition Facts" is an area that is included on the labels of almost all packaged food and beverages in the [place]. It includes the serving size of that food or beverage and information about the nutrients in each serving of the food, including the grams (g) of carbohydrate per serving.  Decide the number of servings of this food or beverage that you will be able to eat or drink. Multiply that number of servings by the number of grams of carbohydrate that is  listed on the label for that serving. The total will be the amount of carbohydrates you will be having when you eat or drink this food or beverage. Learning Standard Serving Sizes of Food When you eat food that is not packaged or does not include "Nutrition Facts" on the label, you need to measure the servings in order to count the amount of carbohydrates.A serving of most carbohydrate-rich foods contains about 15 g of carbohydrates. The following list includes serving sizes of carbohydrate-rich foods that provide 15 g ofcarbohydrate per serving:   1 slice of bread (1 oz) or 1 six-inch tortilla.    of a hamburger bun or English muffin.  4-6 crackers.   cup unsweetened dry cereal.    cup hot cereal.   cup rice or pasta.    cup mashed potatoes or  of a large baked potato.  1 cup fresh fruit or one small piece of fruit.    cup canned or frozen fruit or fruit juice.  1 cup milk.   cup plain fat-free yogurt or yogurt sweetened with artificial sweeteners.   cup cooked dried beans or starchy vegetable, such as peas, corn, or potatoes.  Decide the number of standard-size servings that you will eat. Multiply that number of servings by 15 (the grams of carbohydrates in that serving). For example, if you eat 2 cups of strawberries, you will have eaten 2 servings and 30 g of carbohydrates (2 servings x 15 g = 30 g).  For foods such as soups and casseroles, in which more than one food is mixed in, you will need to count the carbohydrates in each food that is included. EXAMPLE OF CARBOHYDRATE COUNTING Sample Dinner  3 oz chicken breast.   cup of brown rice.   cup of corn.  1 cup milk.   1 cup strawberries with sugar-free whipped topping.  Carbohydrate Calculation Step 1: Identify the foods that contain carbohydrates:   Rice.   Corn.   Milk.   Strawberries. Step 2:Calculate the number of servings eaten of each:   2 servings of rice.   1 serving of corn.    1 serving of milk.   1 serving of strawberries. Step 3: Multiply each of those number of servings by 15 g:   2 servings of rice x 15 g = 30 g.   1 serving of corn x 15 g = 15 g.   1 serving of milk x 15 g = 15 g.   1 serving of strawberries x 15 g = 15 g. Step 4: Add together all of the amounts to find the total grams of carbohydrates eaten: 30 g + 15 g + 15 g + 15 g = 75 g. Document Released: 01/08/2005 Document Revised: 05/25/2013 Document Reviewed: 12/05/2012 [facility] Patient Information 2015 [place], [place]. This information is not intended to replace advice given to you by your health care provider. Make sure you discuss any questions you have with your health care provider.

## 2014-11-08 ENCOUNTER — Encounter: Payer: Self-pay | Admitting: Adult Health

## 2014-11-08 ENCOUNTER — Ambulatory Visit (INDEPENDENT_AMBULATORY_CARE_PROVIDER_SITE_OTHER): Payer: 59 | Admitting: Adult Health

## 2014-11-08 VITALS — Temp 98.3°F

## 2014-11-08 DIAGNOSIS — R42 Dizziness and giddiness: Secondary | ICD-10-CM | POA: Diagnosis not present

## 2014-11-08 MED ORDER — FLUOXETINE HCL 20 MG PO TABS: 20.0000 mg | ORAL_TABLET | Freq: Every day | ORAL | Status: DC

## 2014-11-08 NOTE — Patient Instructions (Signed)
It was great seeing you again.   Please start the Prozac tonight and then start taking it every morning.   Someone will call you to schedule an appointment for either [facility] or [facility].   If you need anything, I am here.

## 2014-11-08 NOTE — Progress Notes (Signed)
Subjective:    Patient ID: Robin Tucker, female    DOB: 04-16-1950, 64 y.o.   MRN: [id number]  HPI  64 year old female who presents to the office today for continued vertigo. I last saw here in September at which time, she endorsed that her vertigo was seeming to improve at this time.She had been seen by Neurology prior to our September visit.    Currently, she is tearful in the office and endorses that her vertigo has been becoming worse and she is ready to look else where for help. She has a constant feeling of "the world spinning". She endorses that it is affecting her ADL's more than it has in the past. She is unable to stand up to fold laundry or do the dishes. She is finding it hard to drive. She is unable to apply for jobs because the computer screen makes her dizzy.   I have recommended in the past that we try an SSRI as there are reports that show that this class of medication can help with vertigo. I also recommended a referral to [employer] or [employer].   Review of Systems  Constitutional: Positive for activity change.  HENT: Negative.   Respiratory: Negative.   Cardiovascular: Negative.   Gastrointestinal: Negative.   Skin: Negative.   Neurological: Positive for dizziness and weakness. Negative for seizures, syncope, speech difficulty, light-headedness and headaches.  Psychiatric/Behavioral: Positive for sleep disturbance and agitation.  All other systems reviewed and are negative.  Past Medical History  Diagnosis Date  . Vertigo   . Anxiety   . Depression     Social History   Social History  . Marital Status: Single    Spouse Name: N/A  . Number of Children: N/A  . Years of Education: N/A   Occupational History  . Not on file.   Social History Main Topics  . Smoking status: Never Smoker   . Smokeless tobacco: Not on file  . Alcohol Use: 0.0 oz/week    0 Standard drinks or equivalent per week     Comment: a glass of wine a week  . Drug Use:  No  . Sexual Activity: No   Other Topics Concern  . Not on file   Social History Narrative   Has 4 children    One child lives with her.    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 Cesarean section      per pt 4  . Ablation on endometriosis      Family History  Problem Relation Age of Onset  . Heart disease Mother   . Leukemia Father   . Colon cancer Mother   . Colon cancer Father     Allergies  Allergen Reactions  . Codeine Hives  . Naproxen Hives    Current Outpatient Prescriptions on File Prior to Visit  Medication Sig Dispense Refill  . acetaminophen (TYLENOL) 500 MG tablet Take 1,000 mg by mouth every 6 (six) hours as needed for moderate pain (pain).    . Cholecalciferol (VITAMIN D3) 2000 UNITS capsule Take 2,000 Units by mouth daily.    . diazepam (VALIUM) 5 MG tablet Take one every 6 hours as needed for dizziness 120 tablet 0  . meclizine (ANTIVERT) 25 MG tablet Take one or 2 tablets every 6 or 8 hours as needed for dizziness 60 tablet 1  . Methylsulfonylmethane (MSM)  1000 MG TABS Take 1 tablet by mouth 3 (three) times daily.    . Multiple Vitamins-Minerals (MULTIVITAMIN & MINERAL PO) Take 1 tablet by mouth daily.    . promethazine (PHENERGAN) 25 MG tablet Take 1 tablet (25 mg total) by mouth every 6 (six) hours as needed for nausea or vomiting. 65 tablet 0   No current facility-administered medications on file prior to visit.    Temp(Src) 98.3 F (36.8 C) (Oral)       Objective:   Physical Exam  Constitutional: She is oriented to person, place, and time. She appears well-developed and well-nourished. No distress.  HENT:  Head: Normocephalic and atraumatic.  Right Ear: External ear normal.  Left Ear: External ear normal.  Nose: Nose normal.  Mouth/Throat: Oropharynx is clear and moist. No oropharyngeal exudate.  Eyes: Conjunctivae and EOM are normal. Pupils are equal, round, and reactive to  light. Right eye exhibits no discharge. Left eye exhibits no discharge.  Cardiovascular: Normal rate, regular rhythm, normal heart sounds and intact distal pulses.  Exam reveals no gallop and no friction rub.   No murmur heard. Pulmonary/Chest: Effort normal and breath sounds normal. No respiratory distress. She has no wheezes. She has no rales. She exhibits no tenderness.  Musculoskeletal: Normal range of motion. She exhibits no edema or tenderness.  Neurological: She is alert and oriented to person, place, and time. She has normal reflexes.  Skin: Skin is warm and dry. No rash noted. She is not diaphoretic. No erythema. No pallor.  Psychiatric: She has a normal mood and affect. Her behavior is normal. Judgment and thought content normal.  Nursing note and vitals reviewed.      Assessment & Plan:  1. Vertigo - FLUoxetine (PROZAC) 20 MG tablet; Take 1 tablet (20 mg total) by mouth daily.  Dispense: 30 tablet; Refill: 3 - Ambulatory referral to Neurology, either [place] or [facility].  - Follow up in 2 weeks with results of starting Prozac

## 2014-11-08 NOTE — Progress Notes (Signed)
Pre visit review using our clinic review tool, if applicable. No additional management support is needed unless otherwise documented below in the visit note. 

## 2014-11-16 ENCOUNTER — Ambulatory Visit (AMBULATORY_SURGERY_CENTER): Payer: Self-pay | Admitting: *Deleted

## 2014-11-16 VITALS — Ht 64.0 in | Wt 230.0 lb

## 2014-11-16 DIAGNOSIS — Z800 Family history of malignant neoplasm of digestive organs: Secondary | ICD-10-CM

## 2014-11-16 NOTE — Progress Notes (Signed)
No egg or soy allergy  No anesthesia or intubation problems per pt  No diet medications taken  Registered in [place] sample given to pt

## 2014-11-30 ENCOUNTER — Encounter: Payer: Self-pay | Admitting: Gastroenterology

## 2014-11-30 ENCOUNTER — Ambulatory Visit (AMBULATORY_SURGERY_CENTER): Payer: 59 | Admitting: Gastroenterology

## 2014-11-30 ENCOUNTER — Telehealth: Payer: Self-pay | Admitting: Adult Health

## 2014-11-30 VITALS — BP 138/78 | HR 68 | Temp 97.3°F | Resp 18 | Ht 64.0 in | Wt 230.0 lb

## 2014-11-30 DIAGNOSIS — D124 Benign neoplasm of descending colon: Secondary | ICD-10-CM | POA: Diagnosis not present

## 2014-11-30 DIAGNOSIS — D125 Benign neoplasm of sigmoid colon: Secondary | ICD-10-CM | POA: Diagnosis not present

## 2014-11-30 DIAGNOSIS — Z1211 Encounter for screening for malignant neoplasm of colon: Secondary | ICD-10-CM | POA: Diagnosis not present

## 2014-11-30 DIAGNOSIS — D122 Benign neoplasm of ascending colon: Secondary | ICD-10-CM | POA: Diagnosis not present

## 2014-11-30 DIAGNOSIS — Z800 Family history of malignant neoplasm of digestive organs: Secondary | ICD-10-CM | POA: Diagnosis present

## 2014-11-30 DIAGNOSIS — D120 Benign neoplasm of cecum: Secondary | ICD-10-CM | POA: Diagnosis not present

## 2014-11-30 MED ORDER — SODIUM CHLORIDE 0.9 % IV SOLN: 500.0000 mL | INTRAVENOUS | Status: DC

## 2014-11-30 NOTE — Patient Instructions (Signed)

## 2014-11-30 NOTE — Progress Notes (Signed)
A/ox3 pleased with MAC, report to Sheila RN 

## 2014-11-30 NOTE — Progress Notes (Signed)
Called to room to assist during endoscopic procedure.  Patient ID and intended procedure confirmed with present staff. Received instructions for my participation in the procedure from the performing physician.  

## 2014-11-30 NOTE — Telephone Encounter (Signed)
Robin Tucker this pt saw dr Tomi Likens on 09-21-14 and has [employer] and Neoma Laming did not do the referral due to she was not aware of appt date and time and the provider. Pt has bill for 500.00 dollars. Please help this patient

## 2014-11-30 NOTE — Op Note (Signed)
[facility]  [address]. [place], [address]   COLONOSCOPY PROCEDURE REPORT  PATIENT: Robin Tucker, Robin Tucker  MR#: [id number] BIRTHDATE: February 19, 1950 , 43  yrs. old GENDER: female ENDOSCOPIST: Milus Banister, MD REFERRED BY: Beaulah Dinning, MD PROCEDURE DATE:  11/30/2014 PROCEDURE:   Colonoscopy, screening and Colonoscopy with snare polypectomy First Screening Colonoscopy - Avg.  risk and is 50 yrs.  old or older Yes.  Prior Negative Screening - Now for repeat screening. N/A  History of Adenoma - Now for follow-up colonoscopy & has been > or = to 3 yrs.  N/A  Polyps removed today? Yes ASA CLASS:   Class II INDICATIONS:Screening for colonic neoplasia and Colorectal Neoplasm Risk Assessment for this procedure is elevated  risk (mother had colon cancer). MEDICATIONS: Monitored anesthesia care and Propofol 250 mg IV  DESCRIPTION OF PROCEDURE:   After the risks benefits and alternatives of the procedure were thoroughly explained, informed consent was obtained.  The digital rectal exam revealed no abnormalities of the rectum.   The LB [id number] and LB [id number]  endoscope was introduced through the anus and advanced to the cecum, which was identified by both the appendix and ileocecal valve. No adverse events experienced.   The quality of the prep was excellent.  The instrument was then slowly withdrawn as the colon was fully examined. Estimated blood loss is zero unless otherwise noted in this procedure report.   COLON FINDINGS: Five sessile polyps ranging between 3-43mm in size were found in the descending colon, sigmoid colon, at the cecum, and in the ascending colon.  Polypectomies were performed with a cold snare.  The resection was complete, the polyp tissue was completely retrieved and sent to histology.   The examination was otherwise normal.  Retroflexed views revealed no abnormalities. The time to cecum = 2.4 Withdrawal time = 10.2   The scope  was withdrawn and the procedure completed. COMPLICATIONS: There were no immediate complications.  ENDOSCOPIC IMPRESSION: 1.   Five sessile polyps ranging between 3-50mm in size were found in the descending colon, sigmoid colon, at the cecum, and in the ascending colon; polypectomies were performed with a cold snare 2.   The examination was otherwise normal  RECOMMENDATIONS: If the polyp(s) removed today are proven to be adenomatous (pre-cancerous) polyps, you will need a colonoscopy in 3-5 years. You will receive a letter within 1-2 weeks with the results of your biopsy as well as final recommendations.  Please call my office if you have not received a letter after 3 weeks.  eSigned:  Milus Banister, MD 11/30/2014 2:07 PM

## 2014-12-01 ENCOUNTER — Telehealth: Payer: Self-pay | Admitting: *Deleted

## 2014-12-01 NOTE — Telephone Encounter (Signed)
Neoma Laming,  Please contact [employer] and ask to speak to a manager in the referrals department. Explain that we did refer the patient to Dr Geanie Logan but that we never received an appointment date and time or who the patient was seening from the provider or the patient. Ask for a one time exception. If they refuse, it will most likely be the patents bill. That would be between her and Dr Starr Lake office.

## 2014-12-01 NOTE — Telephone Encounter (Signed)
Left message on f/u callback 

## 2014-12-09 ENCOUNTER — Encounter: Payer: Self-pay | Admitting: Gastroenterology

## 2014-12-10 NOTE — Telephone Encounter (Signed)
Pt is calling back checking on status of back dated referral. Pt will callback on monday

## 2014-12-24 ENCOUNTER — Telehealth: Payer: Self-pay | Admitting: Adult Health

## 2014-12-24 DIAGNOSIS — R42 Dizziness and giddiness: Secondary | ICD-10-CM

## 2014-12-24 MED ORDER — DIAZEPAM 5 MG PO TABS: ORAL_TABLET | ORAL | Status: DC

## 2014-12-24 NOTE — Telephone Encounter (Signed)
Ok to refill 

## 2014-12-24 NOTE — Telephone Encounter (Signed)
Pt requesting refill of diazepam (VALIUM) 5 MG tablet.  Pharmacy;  [facility]

## 2014-12-24 NOTE — Telephone Encounter (Signed)
Rx sent to pharmacy   

## 2014-12-24 NOTE — Telephone Encounter (Signed)
Ok to refill for one month  

## 2015-04-15 ENCOUNTER — Other Ambulatory Visit: Payer: Self-pay | Admitting: Adult Health

## 2015-04-15 NOTE — Telephone Encounter (Signed)
Ok to refill for one month  

## 2015-04-15 NOTE — Telephone Encounter (Signed)
Ok to refill 

## 2015-04-19 NOTE — Telephone Encounter (Signed)
Rx called in as directed.   

## 2015-08-27 ENCOUNTER — Other Ambulatory Visit: Payer: Self-pay | Admitting: Adult Health

## 2015-08-29 NOTE — Telephone Encounter (Signed)
Ok to refill 

## 2015-11-24 ENCOUNTER — Other Ambulatory Visit: Payer: Self-pay | Admitting: Adult Health

## 2015-11-24 NOTE — Telephone Encounter (Signed)
Rx called in as directed.   

## 2015-11-24 NOTE — Telephone Encounter (Signed)
Ok to refill for one month  

## 2015-11-24 NOTE — Telephone Encounter (Signed)
Ok to refill 

## 2015-12-28 ENCOUNTER — Telehealth: Payer: Self-pay | Admitting: Adult Health

## 2015-12-28 NOTE — Telephone Encounter (Signed)
Yes please

## 2015-12-28 NOTE — Telephone Encounter (Signed)
Please schedule patient in available slot. Thanks!

## 2015-12-28 NOTE — Telephone Encounter (Signed)
Will route to [facility] as FYI. Should patient make appt?

## 2015-12-28 NOTE — Telephone Encounter (Signed)
° °  FYI;   Pt call to say that she is still having vertigo symptons. She said she had one on 12/25/15 and on a scale 1-10 it was a 9 .

## 2015-12-28 NOTE — Telephone Encounter (Signed)
Left message on personalized voice mail to call and make appt.

## 2016-06-12 ENCOUNTER — Other Ambulatory Visit: Payer: Self-pay | Admitting: Adult Health

## 2016-06-14 ENCOUNTER — Other Ambulatory Visit: Payer: Self-pay | Admitting: Adult Health

## 2016-06-14 NOTE — Telephone Encounter (Signed)
She needs a office visit for a CPE for futher refills

## 2016-06-14 NOTE — Telephone Encounter (Signed)
Rx has been sent in as directed. Patient notified that she must have CPE for further refills.

## 2016-06-19 IMAGING — MR MR HEAD WO/W CM
10 of 12 series · 35 of 48 positions shown · IV contrast (multihance)
Comparison: None.

CLINICAL DATA: Severe vertigo and headaches, present for 2 days and
worsening.

EXAM:
MRI HEAD WITHOUT AND WITH CONTRAST
TECHNIQUE: Multiplanar, multiecho pulse sequences of the brain and surrounding
structures were obtained without and with intravenous contrast.
CONTRAST:  20mL MULTIHANCE GADOBENATE DIMEGLUMINE 529 MG/ML IV SOLN

[Series 3: T1 · sagittal · 5.0mm · 0.47mm/px · 3 of 24 slices shown]
[im 1/24]
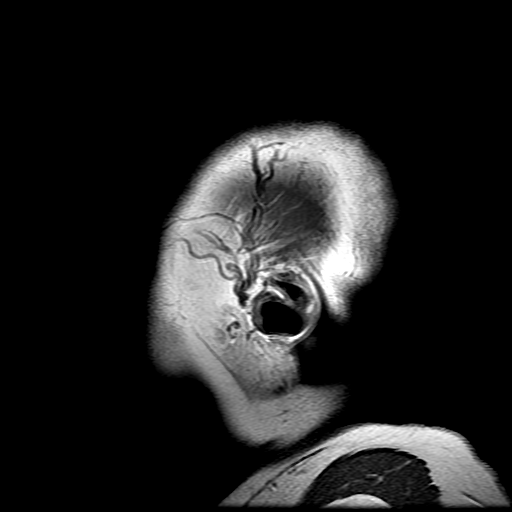
[im 12/24]
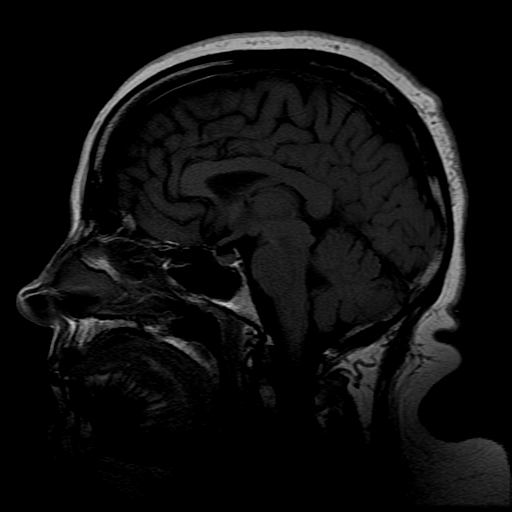
[im 24/24]
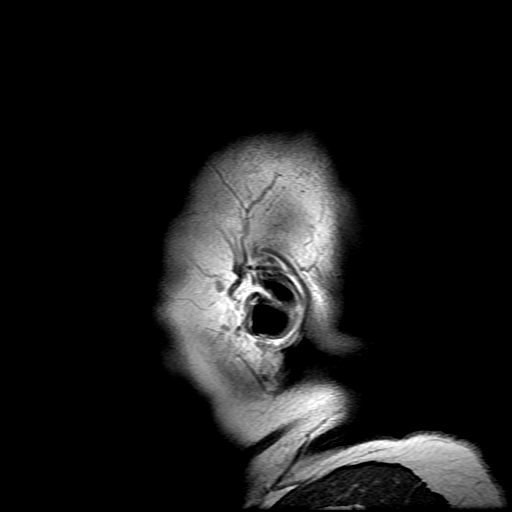

[Series 4: DWI · axial · 3.0mm · 1.09mm/px · z∈[-9,+134]mm · 9 of 98 slices shown (1 of 4)]
[im 1/98]
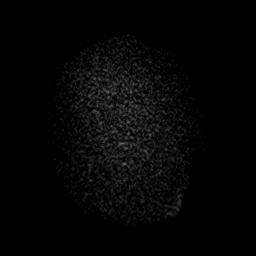
[im 13/98]
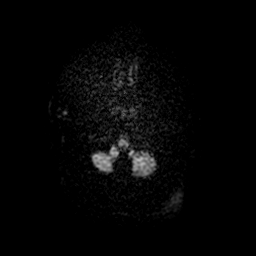
[im 25/98]
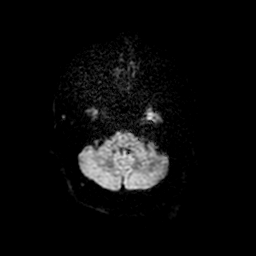
[im 37/98]
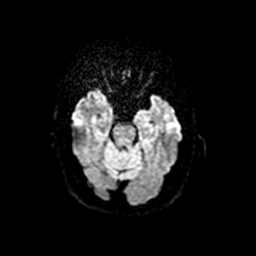
[im 49/98]
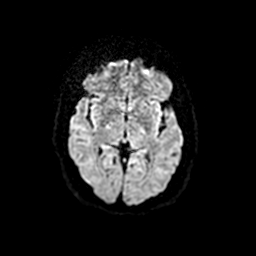
[im 61/98]
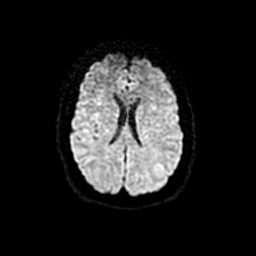
[im 73/98]
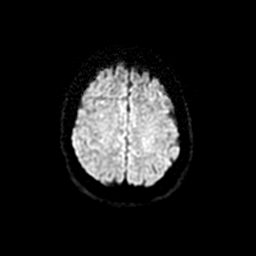
[im 85/98]
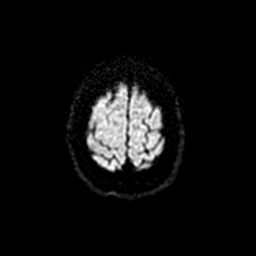
[im 98/98]
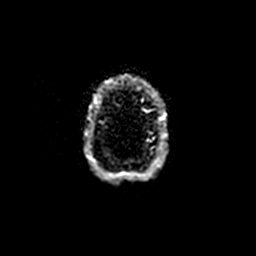

[Series 5: DWI · coronal · 5.0mm · 1.09mm/px · 6 of 66 slices shown (2 of 4)]
[im 1/66]
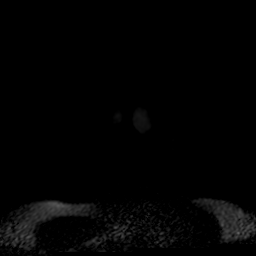
[im 14/66]
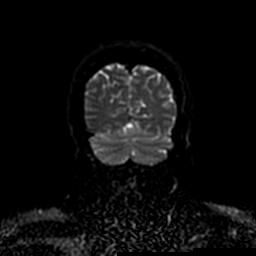
[im 27/66]
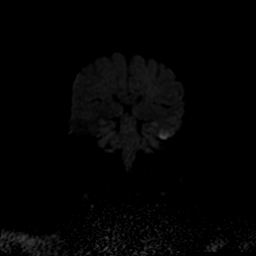
[im 40/66]
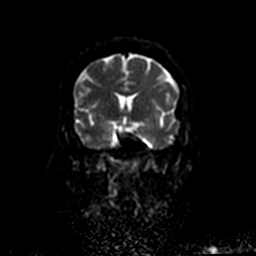
[im 53/66]
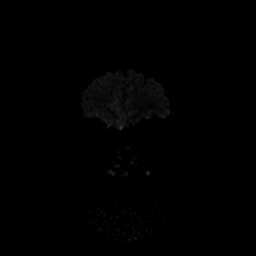
[im 66/66]
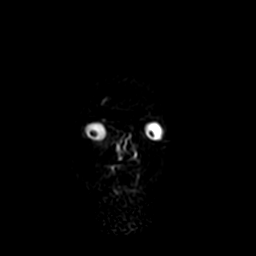

[Series 6: T2 · axial · 5.0mm · 0.43mm/px · z∈[-13,+135]mm · 2 of 24 slices shown]
[im 1/24]
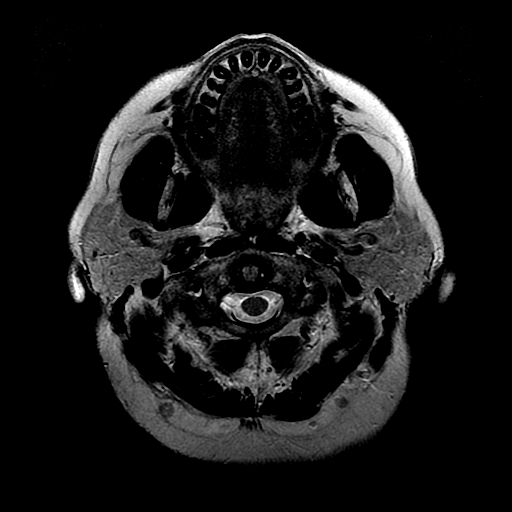
[im 24/24]
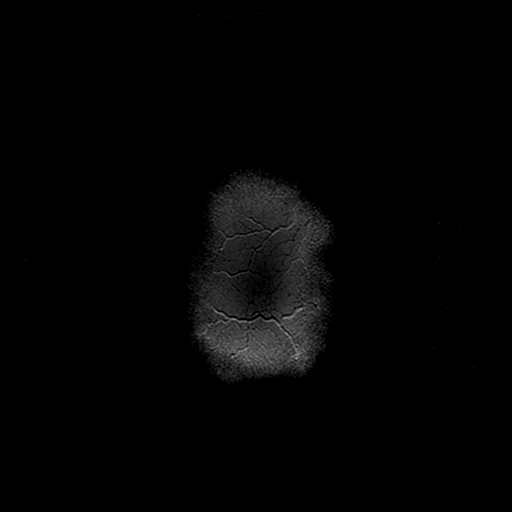

[Series 7: FLAIR · axial · 5.0mm · 0.43mm/px · z∈[-19,+141]mm · 2 of 24 slices shown]
[im 1/24]
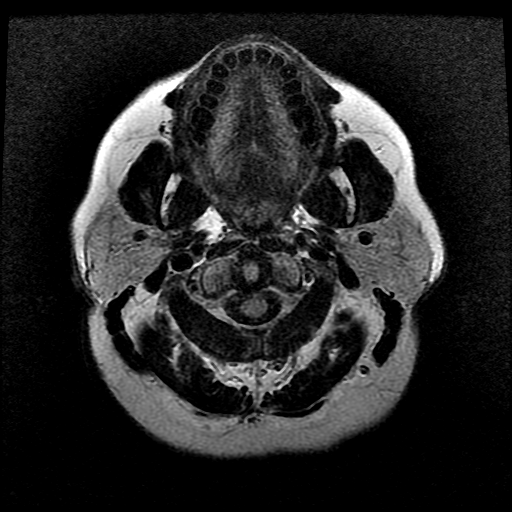
[im 24/24]
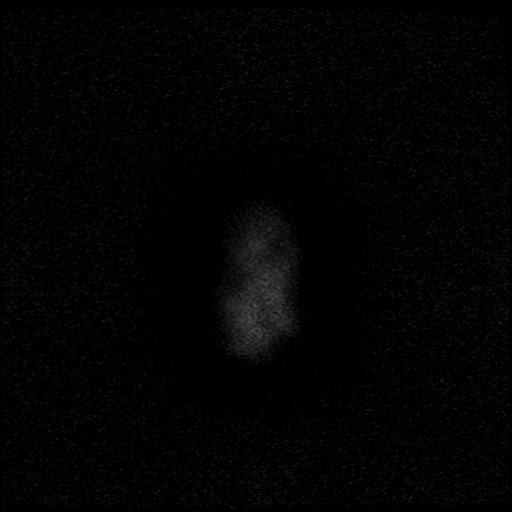

[Series 8: ax mpgr · axial · 5.0mm · 0.43mm/px · 1 of 24 slices shown]
[im 1/24]
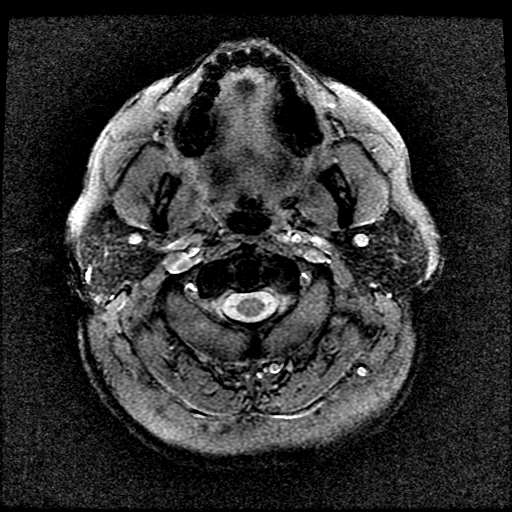

[Series 10: T2 post-contrast · coronal · 5.0mm · 0.45mm/px · 2 of 25 slices shown]
[im 1/25]
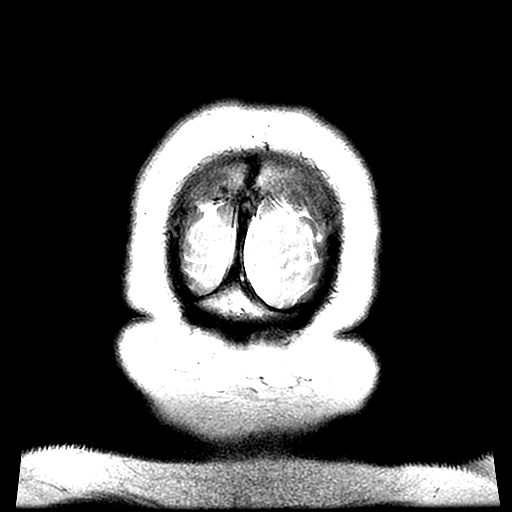
[im 25/25]
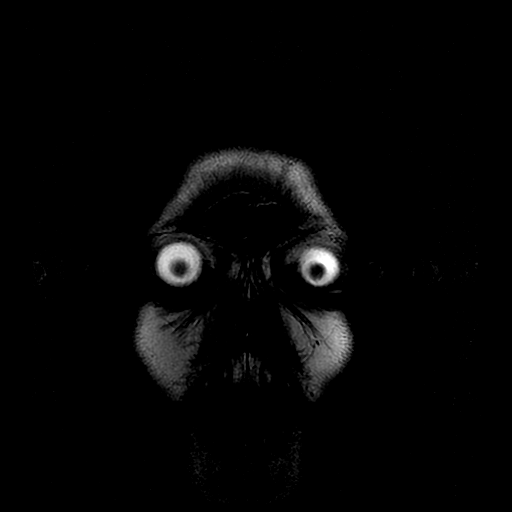

[Series 12: T1 post-contrast · coronal · 5.0mm · 0.45mm/px · 2 of 25 slices shown]
[im 1/25]
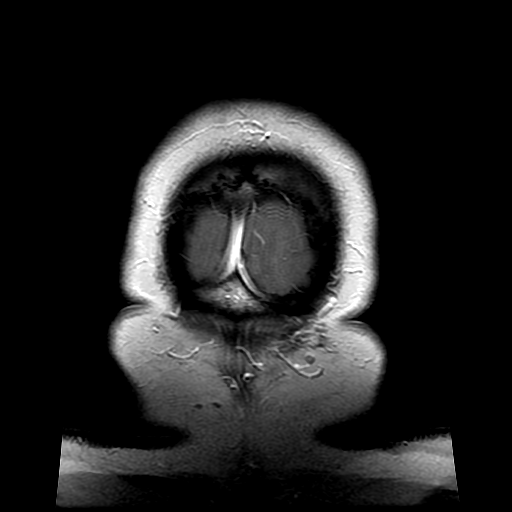
[im 25/25]
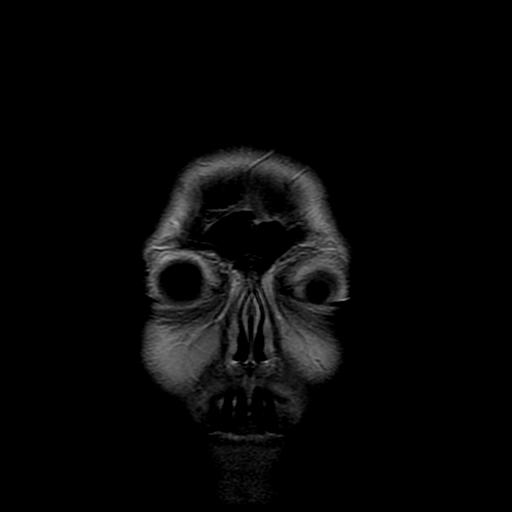

[Series 400: DWI · axial · 3.0mm · 1.09mm/px · z∈[-9,+134]mm · 5 of 49 slices shown (3 of 4)]
[im 1/49]
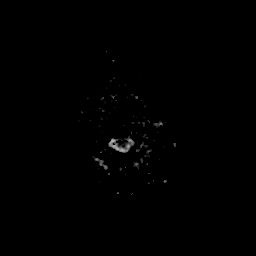
[im 13/49]
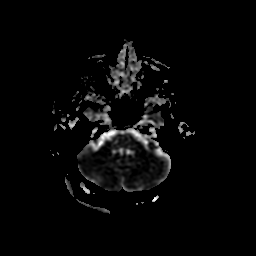
[im 25/49]
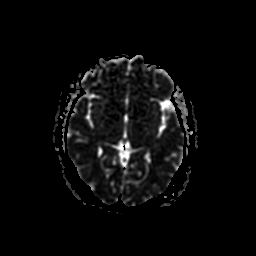
[im 37/49]
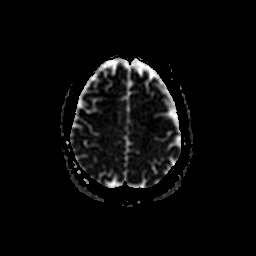
[im 49/49]
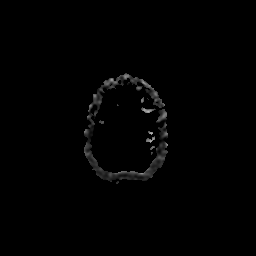

[Series 500: DWI · coronal · 5.0mm · 1.09mm/px · 3 of 33 slices shown (4 of 4)]
[im 1/33]
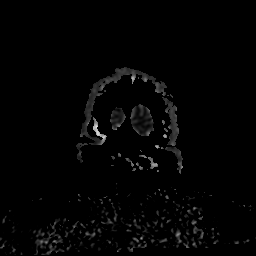
[im 17/33]
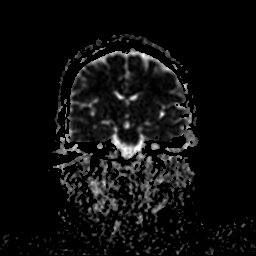
[im 33/33]
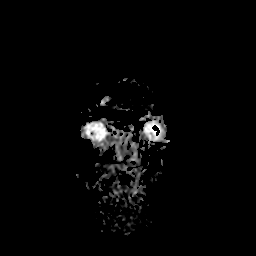

[35 of 48 positions shown; findings below may reference images not displayed]

FINDINGS: There is no acute infarct. Ventricles and sulci are normal for age.
There is no evidence of intracranial hemorrhage, mass, midline
shift, or extra-axial fluid collection. No significant white matter
disease is seen. There is no abnormal enhancement.

Orbits are unremarkable. Focal right posterior ethmoid air cell and
right sphenoid sinus mucosal thickening is noted. Major intracranial
vascular flow voids are preserved. Calvarium and scalp soft tissues
are unremarkable.
IMPRESSION: Unremarkable appearance of the brain.

## 2016-09-06 ENCOUNTER — Other Ambulatory Visit: Payer: Self-pay | Admitting: Adult Health

## 2016-09-06 NOTE — Telephone Encounter (Signed)
Needs an appointment for further refills.  Medication request denied.

## 2016-09-10 ENCOUNTER — Other Ambulatory Visit: Payer: Self-pay | Admitting: Adult Health

## 2016-09-11 NOTE — Telephone Encounter (Signed)
Denied.  Pt must have an appt for refills of this medication.

## 2016-09-15 ENCOUNTER — Other Ambulatory Visit: Payer: Self-pay | Admitting: Adult Health

## 2016-09-18 NOTE — Telephone Encounter (Signed)
Called to the pharmacy and left on machine. 

## 2016-09-18 NOTE — Telephone Encounter (Signed)
Ok to refill for 30 days  

## 2016-10-12 ENCOUNTER — Encounter: Payer: Self-pay | Admitting: Adult Health

## 2017-04-22 ENCOUNTER — Other Ambulatory Visit: Payer: Self-pay | Admitting: Adult Health

## 2017-04-23 NOTE — Telephone Encounter (Signed)
DENIED. MUST HAVE APPOINTMENT FOR FURTHER REFILLS OF THIS MEDICATION.

## 2017-05-18 ENCOUNTER — Other Ambulatory Visit: Payer: Self-pay | Admitting: Adult Health

## 2017-05-20 NOTE — Telephone Encounter (Signed)
DENIED.  PATIENT NEEDS AN APPOINTMENT FOR FURTHER REFILLS OF THIS MEDICATION.  MESSAGE SENT TO THE PHARMACY.

## 2018-01-12 ENCOUNTER — Encounter: Payer: Self-pay | Admitting: Gastroenterology

## 2019-04-14 ENCOUNTER — Other Ambulatory Visit: Payer: Self-pay

## 2019-04-14 ENCOUNTER — Encounter: Payer: Self-pay | Admitting: Gastroenterology

## 2019-04-14 ENCOUNTER — Ambulatory Visit (INDEPENDENT_AMBULATORY_CARE_PROVIDER_SITE_OTHER): Payer: Medicare Other | Admitting: Adult Health

## 2019-04-14 ENCOUNTER — Encounter: Payer: Self-pay | Admitting: Adult Health

## 2019-04-14 VITALS — BP 130/80 | Temp 97.6°F | Wt 225.0 lb

## 2019-04-14 DIAGNOSIS — R7309 Other abnormal glucose: Secondary | ICD-10-CM

## 2019-04-14 DIAGNOSIS — Z1211 Encounter for screening for malignant neoplasm of colon: Secondary | ICD-10-CM

## 2019-04-14 DIAGNOSIS — R42 Dizziness and giddiness: Secondary | ICD-10-CM | POA: Diagnosis not present

## 2019-04-14 DIAGNOSIS — R7989 Other specified abnormal findings of blood chemistry: Secondary | ICD-10-CM | POA: Diagnosis not present

## 2019-04-14 LAB — CBC WITH DIFFERENTIAL/PLATELET
Basophils Absolute: 0.1 10*3/uL (ref 0.0–0.1)
Basophils Relative: 0.8 % (ref 0.0–3.0)
Eosinophils Absolute: 0.1 10*3/uL (ref 0.0–0.7)
Eosinophils Relative: 1.1 % (ref 0.0–5.0)
HCT: 42.9 % (ref 36.0–46.0)
Hemoglobin: 14.6 g/dL (ref 12.0–15.0)
Lymphocytes Relative: 36.7 % (ref 12.0–46.0)
Lymphs Abs: 2.4 10*3/uL (ref 0.7–4.0)
MCHC: 34 g/dL (ref 30.0–36.0)
MCV: 98.6 fl (ref 78.0–100.0)
Monocytes Absolute: 0.5 10*3/uL (ref 0.1–1.0)
Monocytes Relative: 7.2 % (ref 3.0–12.0)
Neutro Abs: 3.5 10*3/uL (ref 1.4–7.7)
Neutrophils Relative %: 54.2 % (ref 43.0–77.0)
Platelets: 262 10*3/uL (ref 150.0–400.0)
RBC: 4.35 Mil/uL (ref 3.87–5.11)
RDW: 12.8 % (ref 11.5–15.5)
WBC: 6.5 10*3/uL (ref 4.0–10.5)

## 2019-04-14 LAB — COMPREHENSIVE METABOLIC PANEL
ALT: 42 U/L — ABNORMAL HIGH (ref 0–35)
AST: 24 U/L (ref 0–37)
Albumin: 4.2 g/dL (ref 3.5–5.2)
Alkaline Phosphatase: 107 U/L (ref 39–117)
BUN: 12 mg/dL (ref 6–23)
CO2: 27 mEq/L (ref 19–32)
Calcium: 9.8 mg/dL (ref 8.4–10.5)
Chloride: 99 mEq/L (ref 96–112)
Creatinine, Ser: 0.75 mg/dL (ref 0.40–1.20)
GFR: 77.02 mL/min (ref >60.00)
Glucose, Bld: 356 mg/dL — ABNORMAL HIGH (ref 70–99)
Potassium: 4.5 mEq/L (ref 3.5–5.1)
Sodium: 134 mEq/L — ABNORMAL LOW (ref 135–145)
Total Bilirubin: 0.9 mg/dL (ref 0.2–1.2)
Total Protein: 6.7 g/dL (ref 6.0–8.3)

## 2019-04-14 LAB — HEMOGLOBIN A1C: Hgb A1c MFr Bld: 11.5 % — ABNORMAL HIGH (ref 4.6–6.5)

## 2019-04-14 LAB — TSH: TSH: 3.21 u[IU]/mL (ref 0.35–4.50)

## 2019-04-14 MED ORDER — DIAZEPAM 5 MG PO TABS: 5.0000 mg | ORAL_TABLET | Freq: Two times a day (BID) | ORAL | 0 refills | Status: DC | PRN

## 2019-04-14 NOTE — Progress Notes (Deleted)
   Subjective:    Patient ID: Robin Tucker, female    DOB: 1950-06-26, 69 y.o.   MRN: [id number]  HPI    Review of Systems     Objective:   Physical Exam        Assessment & Plan:

## 2019-04-14 NOTE — Patient Instructions (Addendum)
It was great seeing you today and welcome back   I will follow up with you regarding your blood work   Please work on diet and exercise   Someone will call you to schedule your colonoscopy

## 2019-04-14 NOTE — Progress Notes (Signed)
Patient presents to clinic today to re-establish care.  She was last seen in the office in October 2016.   Acute Concerns: Reestablish Care   Chronic Issues: Vertigo - has been present for about 4.5 years. She has tried Meclizine and valium in the past. Meclizine did not work well for her. She reports that over the years her symptoms have been improving and she has been having less severe attacks lately - from a few a week to once a month. She has been to a few specialists but refuses to go back.   Glucose Intolerance -  Lab Results  Component Value Date   HGBA1C 6.7 (H) 09/13/2014   Subclinical hypothyroidism - denies symptoms currently.  Lab Results  Component Value Date   TSH 4.79 (H) 09/13/2014    Health Maintenance: Dental -- does not do routine care.  Vision -- Routine care Immunizations -- refuses all vaccinations  Colonoscopy -- 2016 - is on the 3-5 year plan. Has not followed up.  Mammogram -- refuses  PAP -- refuses  Bone Density -- refuses  Diet: Does not follow a heart healthy diet.  Exercise: Does not exercise    Past Medical History:  Diagnosis Date  . Anxiety   . Depression   . Vertigo     Past Surgical History:  Procedure Laterality Date  . ABLATION ON ENDOMETRIOSIS    . CESAREAN SECTION     per pt 4    No current outpatient medications on file prior to visit.   No current facility-administered medications on file prior to visit.    Allergies  Allergen Reactions  . Codeine Hives    Family History  Problem Relation Age of Onset  . Heart disease Mother   . Colon cancer Mother   . Leukemia Father   . Colon cancer Father   . Esophageal cancer Neg Hx   . Rectal cancer Neg Hx   . Stomach cancer Neg Hx     Social History   Socioeconomic History  . Marital status: Single    Spouse name: Not on file  . Number of children: Not on file  . Years of education: Not on file  . Highest education level: Not on file  Occupational History   . Not on file  Tobacco Use  . Smoking status: Never Smoker  . Smokeless tobacco: Never Used  Substance and Sexual Activity  . Alcohol use: No    Alcohol/week: 0.0 standard drinks    Comment: occasional  . Drug use: No  . Sexual activity: Never  Other Topics Concern  . Not on file  Social History Narrative   Has 4 children    One child lives with her.    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 Difficulty of Paying Living Expenses:   Food Insecurity:   . Worried About Charity fundraiser in the Last Year:   . Arboriculturist in the Last Year:   Transportation Needs:   . Film/video editor (Medical):   Marland Kitchen Lack of Transportation (Non-Medical):   Physical Activity:   . Days of Exercise per Week:   . Minutes of Exercise per Session:   Stress:   . Feeling of Stress :   Social Connections:   . Frequency of Communication with Friends and Family:   . Frequency of Social Gatherings with Friends and  Family:   . Attends Religious Services:   . Active Member of Clubs or Organizations:   . Attends Archivist Meetings:   Marland Kitchen Marital Status:   Intimate Partner Violence:   . Fear of Current or Ex-Partner:   . Emotionally Abused:   Marland Kitchen Physically Abused:   . Sexually Abused:     Review of Systems  Constitutional: Negative.   Eyes: Negative.   Respiratory: Negative.   Cardiovascular: Negative.   Gastrointestinal: Negative.   Genitourinary: Negative.   Musculoskeletal: Negative.   Skin: Negative.   Neurological: Positive for dizziness.  Endo/Heme/Allergies: Negative.   Psychiatric/Behavioral: Negative.     BP 130/80   Temp 97.6 F (36.4 C)   Wt 225 lb (102.1 kg)   BMI 38.62 kg/m   Physical Exam Vitals and nursing note reviewed.  Constitutional:      Appearance: Normal appearance. She is obese.  HENT:     Right Ear: Tympanic membrane, ear canal and external ear normal.     Left Ear:  Tympanic membrane, ear canal and external ear normal.  Cardiovascular:     Rate and Rhythm: Normal rate and regular rhythm.     Pulses: Normal pulses.     Heart sounds: Normal heart sounds.  Pulmonary:     Effort: Pulmonary effort is normal.     Breath sounds: Normal breath sounds.  Musculoskeletal:        General: No swelling, tenderness, deformity or signs of injury. Normal range of motion.     Right lower leg: No edema.     Left lower leg: No edema.  Skin:    General: Skin is warm and dry.     Capillary Refill: Capillary refill takes less than 2 seconds.  Neurological:     General: No focal deficit present.     Mental Status: She is alert and oriented to person, place, and time.  Psychiatric:        Mood and Affect: Mood normal.        Behavior: Behavior normal.        Thought Content: Thought content normal.        Judgment: Judgment normal.    Assessment/Plan: 1. Vertigo  - diazepam (VALIUM) 5 MG tablet; Take 1 tablet (5 mg total) by mouth every 12 (twelve) hours as needed.  Dispense: 30 tablet; Refill: 0  2. Elevated glucose - Consider adding metformin - CBC with Differential/Platelet - Comprehensive metabolic panel - Hemoglobin A1c - TSH  3. Colon cancer screening  - Ambulatory referral to Gastroenterology  4. subclinical hypothyroidism - Consider replacement with synthroid  - CBC with Differential/Platelet - Comprehensive metabolic panel - Hemoglobin A1c - TSH  Dorothyann Peng, NP

## 2019-04-15 ENCOUNTER — Encounter: Payer: Self-pay | Admitting: Family Medicine

## 2019-04-17 ENCOUNTER — Telehealth: Payer: Self-pay | Admitting: Adult Health

## 2019-04-17 NOTE — Telephone Encounter (Signed)
Pt returned call from [facility] for lab results, would like a call back.

## 2019-04-20 MED ORDER — METFORMIN HCL 500 MG PO TABS: 500.0000 mg | ORAL_TABLET | Freq: Two times a day (BID) | ORAL | 1 refills | Status: DC

## 2019-04-20 NOTE — Addendum Note (Signed)
Addended by: Agnes Lawrence on: 04/20/2019 10:18 AM   Modules accepted: Orders

## 2019-04-20 NOTE — Telephone Encounter (Signed)
See results note. 

## 2019-05-05 ENCOUNTER — Other Ambulatory Visit: Payer: Self-pay

## 2019-05-05 ENCOUNTER — Ambulatory Visit (AMBULATORY_SURGERY_CENTER): Payer: Self-pay

## 2019-05-05 VITALS — Temp 97.3°F | Ht 64.0 in | Wt 223.0 lb

## 2019-05-05 DIAGNOSIS — Z01818 Encounter for other preprocedural examination: Secondary | ICD-10-CM

## 2019-05-05 DIAGNOSIS — Z86010 Personal history of colonic polyps: Secondary | ICD-10-CM

## 2019-05-05 DIAGNOSIS — Z800 Family history of malignant neoplasm of digestive organs: Secondary | ICD-10-CM

## 2019-05-05 NOTE — Progress Notes (Signed)
No egg or soy allergy known to patient  No issues with past sedation with any surgeries  or procedures, no intubation problems  No diet pills per patient No home 02 use per patient  No blood thinners per patient  Pt denies issues with constipation  No A fib or A flutter  EMMI video sent to pt's e mail   Pt not taking metformin, states she does not believe she is diabetic and that she will lose weight instead of taking medicine.  Let pt know if she does start to take metformin to hold it on the day of the procedure.  Due to the COVID-19 pandemic we are asking patients to follow these guidelines. Please only bring one care partner. Please be aware that your care partner may wait in the car in the parking lot or if they feel like they will be too hot to wait in the car, they may wait in the lobby on the 4th floor. All care partners are required to wear a mask the entire time (we do not have any that we can provide them), they need to practice social distancing, and we will do a Covid check for all patient's and care partners when you arrive. Also we will check their temperature and your temperature. If the care partner waits in their car they need to stay in the parking lot the entire time and we will call them on their cell phone when the patient is ready for discharge so they can bring the car to the front of the building. Also all patient's will need to wear a mask into building.

## 2019-05-13 ENCOUNTER — Other Ambulatory Visit: Payer: Self-pay | Admitting: Gastroenterology

## 2019-05-13 ENCOUNTER — Ambulatory Visit (INDEPENDENT_AMBULATORY_CARE_PROVIDER_SITE_OTHER): Payer: Medicare Other

## 2019-05-13 DIAGNOSIS — Z1159 Encounter for screening for other viral diseases: Secondary | ICD-10-CM

## 2019-05-13 NOTE — Telephone Encounter (Signed)
error 

## 2019-05-14 LAB — SARS CORONAVIRUS 2 (TAT 6-24 HRS): SARS Coronavirus 2: NEGATIVE

## 2019-05-18 ENCOUNTER — Ambulatory Visit (AMBULATORY_SURGERY_CENTER): Payer: Medicare Other | Admitting: Gastroenterology

## 2019-05-18 ENCOUNTER — Encounter: Payer: Self-pay | Admitting: Gastroenterology

## 2019-05-18 ENCOUNTER — Other Ambulatory Visit: Payer: Self-pay

## 2019-05-18 VITALS — BP 141/70 | HR 72 | Temp 97.3°F | Resp 16 | Ht 64.0 in | Wt 223.0 lb

## 2019-05-18 DIAGNOSIS — Z86010 Personal history of colonic polyps: Secondary | ICD-10-CM | POA: Diagnosis not present

## 2019-05-18 DIAGNOSIS — D122 Benign neoplasm of ascending colon: Secondary | ICD-10-CM

## 2019-05-18 DIAGNOSIS — Z01818 Encounter for other preprocedural examination: Secondary | ICD-10-CM | POA: Diagnosis not present

## 2019-05-18 DIAGNOSIS — Z800 Family history of malignant neoplasm of digestive organs: Secondary | ICD-10-CM

## 2019-05-18 DIAGNOSIS — K635 Polyp of colon: Secondary | ICD-10-CM

## 2019-05-18 MED ORDER — SODIUM CHLORIDE 0.9 % IV SOLN: 500.0000 mL | Freq: Once | INTRAVENOUS | Status: DC

## 2019-05-18 NOTE — Progress Notes (Signed)
Patient experienced a bought of Vertigo on attempting to get up and dress at discharge.  She requested she take a Valium out of her personal medications.  MD Ardis Hughs made aware and approved.  After some time the patient reported feeling a little bit better.

## 2019-05-18 NOTE — Progress Notes (Signed)
PT taken to PACU. Monitors in place. VSS. Report given to RN. 

## 2019-05-18 NOTE — Progress Notes (Signed)
Temp LC Vitals by DT  Pt's states no medical or surgical changes since previsit or office visit.

## 2019-05-18 NOTE — Op Note (Signed)
[place] Patient Name: Robin Tucker Procedure Date: 05/18/2019 1:51 PM MRN: [id number] Endoscopist: Milus Banister , MD Age: 69 Referring MD:  Date of Birth: October 21, 1950 Gender: Female Account #: [id number] Procedure:                Colonoscopy Indications:              High risk colon cancer surveillance: Personal                            history of colonic polyps; Colonoscopy 2016 found 5                            subCM polyps (adenomas and SSPs), mother died from                            colon cancer Medicines:                Monitored Anesthesia Care Procedure:                Pre-Anesthesia Assessment:                           - Prior to the procedure, a History and Physical                            was performed, and patient medications and                            allergies were reviewed. The patient's tolerance of                            previous anesthesia was also reviewed. The risks                            and benefits of the procedure and the sedation                            options and risks were discussed with the patient.                            All questions were answered, and informed consent                            was obtained. Prior Anticoagulants: The patient has                            taken no previous anticoagulant or antiplatelet                            agents. ASA Grade Assessment: II - A patient with                            mild systemic disease. After reviewing the risks  and benefits, the patient was deemed in                            satisfactory condition to undergo the procedure.                           After obtaining informed consent, the colonoscope                            was passed under direct vision. Throughout the                            procedure, the patient's blood pressure, pulse, and                            oxygen saturations were monitored continuously.  The                            Colonoscope was introduced through the anus and                            advanced to the the cecum, identified by                            appendiceal orifice and ileocecal valve. The                            colonoscopy was performed without difficulty. The                            patient tolerated the procedure well. The quality                            of the bowel preparation was good. The ileocecal                            valve, appendiceal orifice, and rectum were                            photographed. Scope In: 2:38:05 PM Scope Out: 2:48:40 PM Scope Withdrawal Time: 0 hours 8 minutes 22 seconds  Total Procedure Duration: 0 hours 10 minutes 35 seconds  Findings:                 A 2 mm polyp was found in the ascending colon. The                            polyp was sessile. The polyp was removed with a                            cold snare. Resection and retrieval were complete.                           The exam was otherwise without abnormality on  direct and retroflexion views. Complications:            No immediate complications. Estimated blood loss:                            None. Estimated Blood Loss:     Estimated blood loss: none. Impression:               - One 2 mm polyp in the ascending colon, removed                            with a cold snare. Resected and retrieved.                           - The examination was otherwise normal on direct                            and retroflexion views. Recommendation:           - Patient has a contact number available for                            emergencies. The signs and symptoms of potential                            delayed complications were discussed with the                            patient. Return to normal activities tomorrow.                            Written discharge instructions were provided to the                            patient.                            - Resume previous diet.                           - Continue present medications.                           - Await pathology results. Milus Banister, MD 05/18/2019 3:06:50 PM This report has been signed electronically.

## 2019-05-18 NOTE — Patient Instructions (Signed)
HANDOUTS PROVIDED ON: POLYPS  The polyp removed today have been sent for pathology.  The results can take 1-3 weeks to receive.  When your next colonoscopy should occur will be based on the pathology results.    You may resume your previous diet and medication schedule.  Thank you for allowing us to care for you today!!!   YOU HAD AN ENDOSCOPIC PROCEDURE TODAY AT THE [facility]:   Refer to the procedure report that was given to you for any specific questions about what was found during the examination.  If the procedure report does not answer your questions, please call your gastroenterologist to clarify.  If you requested that your care partner not be given the details of your procedure findings, then the procedure report has been included in a sealed envelope for you to review at your convenience later.  YOU SHOULD EXPECT: Some feelings of bloating in the abdomen. Passage of more gas than usual.  Walking can help get rid of the air that was put into your GI tract during the procedure and reduce the bloating. If you had a lower endoscopy (such as a colonoscopy or flexible sigmoidoscopy) you may notice spotting of blood in your stool or on the toilet paper. If you underwent a bowel prep for your procedure, you may not have a normal bowel movement for a few days.  Please Note:  You might notice some irritation and congestion in your nose or some drainage.  This is from the oxygen used during your procedure.  There is no need for concern and it should clear up in a day or so.  SYMPTOMS TO REPORT IMMEDIATELY:   Following lower endoscopy (colonoscopy or flexible sigmoidoscopy):  Excessive amounts of blood in the stool  Significant tenderness or worsening of abdominal pains  Swelling of the abdomen that is new, acute  Fever of 100F or higher  For urgent or emergent issues, a gastroenterologist can be reached at any hour by calling [phone]. Do not use MyChart messaging for  urgent concerns.    DIET:  We do recommend a small meal at first, but then you may proceed to your regular diet.  Drink plenty of fluids but you should avoid alcoholic beverages for 24 hours.  ACTIVITY:  You should plan to take it easy for the rest of today and you should NOT DRIVE or use heavy machinery until tomorrow (because of the sedation medicines used during the test).    FOLLOW UP: Our staff will call the number listed on your records 48-72 hours following your procedure to check on you and address any questions or concerns that you may have regarding the information given to you following your procedure. If we do not reach you, we will leave a message.  We will attempt to reach you two times.  During this call, we will ask if you have developed any symptoms of COVID 19. If you develop any symptoms (ie: fever, flu-like symptoms, shortness of breath, cough etc.) before then, please call [phone].  If you test positive for Covid 19 in the 2 weeks post procedure, please call and report this information to us.    If any biopsies were taken you will be contacted by phone or by letter within the next 1-3 weeks.  Please call us at [phone] if you have not heard about the biopsies in 3 weeks.    SIGNATURES/CONFIDENTIALITY: You and/or your care partner have signed paperwork which will be entered into   your electronic medical record.  These signatures attest to the fact that that the information above on your After Visit Summary has been reviewed and is understood.  Full responsibility of the confidentiality of this discharge information lies with you and/or your care-partner.

## 2019-05-18 NOTE — Progress Notes (Signed)
Called to room to assist during endoscopic procedure.  Patient ID and intended procedure confirmed with present staff. Received instructions for my participation in the procedure from the performing physician.  

## 2019-05-20 ENCOUNTER — Telehealth: Payer: Self-pay

## 2019-05-20 NOTE — Telephone Encounter (Signed)
  Follow up Call-  Call back number 05/18/2019  Post procedure Call Back phone  # [phone]  Permission to leave phone message Yes  Some recent data might be hidden     Patient questions:  Do you have a fever, pain , or abdominal swelling? No. Pain Score  0 *  Have you tolerated food without any problems? Yes.    Have you been able to return to your normal activities? Yes.    Do you have any questions about your discharge instructions: Diet   No. Medications  No. Follow up visit  No.  Do you have questions or concerns about your Care? No.  Actions: * If pain score is 4 or above: No action needed, pain <4.  1. Have you developed a fever since your procedure? no  2.   Have you had an respiratory symptoms (SOB or cough) since your procedure? no  3.   Have you tested positive for COVID 19 since your procedure no  4.   Have you had any family members/close contacts diagnosed with the COVID 19 since your procedure?  no   If yes to any of these questions please route to Joylene John, RN and Erenest Rasher, RN

## 2019-05-22 ENCOUNTER — Encounter: Payer: Self-pay | Admitting: Gastroenterology

## 2019-07-21 ENCOUNTER — Ambulatory Visit: Payer: Medicare Other | Admitting: Adult Health

## 2019-11-20 ENCOUNTER — Telehealth: Payer: Self-pay | Admitting: Adult Health

## 2019-11-20 NOTE — Telephone Encounter (Signed)
Spoke with patient and explained the AWV and she declined

## 2020-06-30 ENCOUNTER — Telehealth: Payer: Self-pay | Admitting: Adult Health

## 2020-06-30 NOTE — Telephone Encounter (Signed)
Spoke with patient to schedule Medicare Annual Wellness Visit (AWV) either virtually or in office.  She declined do not call.  Also pt needed appointment with cory last appointment 04/13/19 pt stated she is waiting on new insurance information. Patient stated she would not be calling to make appointment with [facility].  She will be changing providers   Last AWV ; please schedule at anytime with [facility] Nurse Health Advisor 1 or 2   This should be a 45 minute visit.

## 2022-09-08 ENCOUNTER — Other Ambulatory Visit: Payer: Self-pay

## 2022-09-08 ENCOUNTER — Encounter (HOSPITAL_COMMUNITY): Payer: Self-pay

## 2022-09-08 ENCOUNTER — Emergency Department (HOSPITAL_COMMUNITY): Payer: Medicare Other

## 2022-09-08 ENCOUNTER — Inpatient Hospital Stay (HOSPITAL_COMMUNITY)
Admission: EM | Admit: 2022-09-08 | Discharge: 2022-09-13 | DRG: 392 | Disposition: A | Discharge status: Home / Self Care | Payer: Medicare Other | Attending: Internal Medicine | Admitting: Internal Medicine

## 2022-09-08 DIAGNOSIS — K760 Fatty (change of) liver, not elsewhere classified: Secondary | ICD-10-CM | POA: Diagnosis not present

## 2022-09-08 DIAGNOSIS — D259 Leiomyoma of uterus, unspecified: Secondary | ICD-10-CM | POA: Diagnosis present

## 2022-09-08 DIAGNOSIS — K828 Other specified diseases of gallbladder: Secondary | ICD-10-CM | POA: Diagnosis not present

## 2022-09-08 DIAGNOSIS — R932 Abnormal findings on diagnostic imaging of liver and biliary tract: Secondary | ICD-10-CM

## 2022-09-08 DIAGNOSIS — Z833 Family history of diabetes mellitus: Secondary | ICD-10-CM

## 2022-09-08 DIAGNOSIS — Z8249 Family history of ischemic heart disease and other diseases of the circulatory system: Secondary | ICD-10-CM

## 2022-09-08 DIAGNOSIS — F32A Depression, unspecified: Secondary | ICD-10-CM | POA: Diagnosis present

## 2022-09-08 DIAGNOSIS — E872 Acidosis, unspecified: Secondary | ICD-10-CM | POA: Diagnosis not present

## 2022-09-08 DIAGNOSIS — Z8719 Personal history of other diseases of the digestive system: Secondary | ICD-10-CM

## 2022-09-08 DIAGNOSIS — E669 Obesity, unspecified: Secondary | ICD-10-CM | POA: Diagnosis present

## 2022-09-08 DIAGNOSIS — F419 Anxiety disorder, unspecified: Secondary | ICD-10-CM | POA: Diagnosis not present

## 2022-09-08 DIAGNOSIS — Z7982 Long term (current) use of aspirin: Secondary | ICD-10-CM

## 2022-09-08 DIAGNOSIS — R110 Nausea: Secondary | ICD-10-CM | POA: Diagnosis not present

## 2022-09-08 DIAGNOSIS — Z5329 Procedure and treatment not carried out because of patient's decision for other reasons: Secondary | ICD-10-CM | POA: Diagnosis present

## 2022-09-08 DIAGNOSIS — I10 Essential (primary) hypertension: Secondary | ICD-10-CM | POA: Diagnosis present

## 2022-09-08 DIAGNOSIS — Z818 Family history of other mental and behavioral disorders: Secondary | ICD-10-CM

## 2022-09-08 DIAGNOSIS — E1165 Type 2 diabetes mellitus with hyperglycemia: Secondary | ICD-10-CM | POA: Diagnosis not present

## 2022-09-08 DIAGNOSIS — R945 Abnormal results of liver function studies: Secondary | ICD-10-CM | POA: Diagnosis not present

## 2022-09-08 DIAGNOSIS — R42 Dizziness and giddiness: Secondary | ICD-10-CM | POA: Diagnosis not present

## 2022-09-08 DIAGNOSIS — K219 Gastro-esophageal reflux disease without esophagitis: Secondary | ICD-10-CM | POA: Diagnosis present

## 2022-09-08 DIAGNOSIS — R112 Nausea with vomiting, unspecified: Secondary | ICD-10-CM | POA: Diagnosis not present

## 2022-09-08 DIAGNOSIS — R739 Hyperglycemia, unspecified: Secondary | ICD-10-CM

## 2022-09-08 DIAGNOSIS — Z885 Allergy status to narcotic agent status: Secondary | ICD-10-CM | POA: Diagnosis not present

## 2022-09-08 DIAGNOSIS — R1013 Epigastric pain: Principal | ICD-10-CM | POA: Diagnosis present

## 2022-09-08 DIAGNOSIS — Z79899 Other long term (current) drug therapy: Secondary | ICD-10-CM | POA: Diagnosis not present

## 2022-09-08 DIAGNOSIS — E876 Hypokalemia: Secondary | ICD-10-CM | POA: Diagnosis present

## 2022-09-08 DIAGNOSIS — K449 Diaphragmatic hernia without obstruction or gangrene: Secondary | ICD-10-CM | POA: Diagnosis present

## 2022-09-08 DIAGNOSIS — R6510 Systemic inflammatory response syndrome (SIRS) of non-infectious origin without acute organ dysfunction: Principal | ICD-10-CM

## 2022-09-08 DIAGNOSIS — F439 Reaction to severe stress, unspecified: Secondary | ICD-10-CM | POA: Diagnosis present

## 2022-09-08 DIAGNOSIS — Z91148 Patient's other noncompliance with medication regimen for other reason: Secondary | ICD-10-CM

## 2022-09-08 DIAGNOSIS — E119 Type 2 diabetes mellitus without complications: Secondary | ICD-10-CM

## 2022-09-08 DIAGNOSIS — Z6835 Body mass index (BMI) 35.0-35.9, adult: Secondary | ICD-10-CM

## 2022-09-08 DIAGNOSIS — R748 Abnormal levels of other serum enzymes: Secondary | ICD-10-CM | POA: Diagnosis not present

## 2022-09-08 DIAGNOSIS — R7989 Other specified abnormal findings of blood chemistry: Secondary | ICD-10-CM

## 2022-09-08 LAB — LIPASE, BLOOD: Lipase: 57 U/L — ABNORMAL HIGH (ref 11–51)

## 2022-09-08 LAB — TROPONIN I (HIGH SENSITIVITY)
Troponin I (High Sensitivity): 12 ng/L (ref <18)
Troponin I (High Sensitivity): 13 ng/L (ref <18)

## 2022-09-08 LAB — I-STAT CG4 LACTIC ACID, ED
Lactic Acid, Venous: 3.9 mmol/L (ref 0.5–1.9)
Lactic Acid, Venous: 2.6 mmol/L (ref 0.5–1.9)

## 2022-09-08 LAB — BASIC METABOLIC PANEL
Anion gap: 14 (ref 5–15)
BUN: 8 mg/dL (ref 8–23)
CO2: 21 mmol/L — ABNORMAL LOW (ref 22–32)
Calcium: 8.6 mg/dL — ABNORMAL LOW (ref 8.9–10.3)
Chloride: 104 mmol/L (ref 98–111)
Creatinine, Ser: 0.78 mg/dL (ref 0.44–1.00)
GFR, Estimated: >60 mL/min (ref >60)
Glucose, Bld: 357 mg/dL — ABNORMAL HIGH (ref 70–99)
Potassium: 3.9 mmol/L (ref 3.5–5.1)
Sodium: 139 mmol/L (ref 135–145)

## 2022-09-08 LAB — URINALYSIS, ROUTINE W REFLEX MICROSCOPIC
Bacteria, UA: NONE SEEN
Bilirubin Urine: NEGATIVE
Glucose, UA: >=500 mg/dL — AB
Hgb urine dipstick: NEGATIVE
Ketones, ur: 20 mg/dL — AB
Leukocytes,Ua: NEGATIVE
Nitrite: NEGATIVE
Protein, ur: NEGATIVE mg/dL
Specific Gravity, Urine: >1.046 — ABNORMAL HIGH (ref 1.005–1.030)
pH: 7 (ref 5.0–8.0)

## 2022-09-08 LAB — CBC WITH DIFFERENTIAL/PLATELET
Abs Immature Granulocytes: 0.04 10*3/uL (ref 0.00–0.07)
Basophils Absolute: 0 10*3/uL (ref 0.0–0.1)
Basophils Relative: 0 %
Eosinophils Absolute: 0 10*3/uL (ref 0.0–0.5)
Eosinophils Relative: 0 %
HCT: 40.5 % (ref 36.0–46.0)
Hemoglobin: 14.4 g/dL (ref 12.0–15.0)
Immature Granulocytes: 0 %
Lymphocytes Relative: 4 %
Lymphs Abs: 0.4 10*3/uL — ABNORMAL LOW (ref 0.7–4.0)
MCH: 34 pg (ref 26.0–34.0)
MCHC: 35.6 g/dL (ref 30.0–36.0)
MCV: 95.7 fL (ref 80.0–100.0)
Monocytes Absolute: 0.5 10*3/uL (ref 0.1–1.0)
Monocytes Relative: 5 %
Neutro Abs: 10.1 10*3/uL — ABNORMAL HIGH (ref 1.7–7.7)
Neutrophils Relative %: 91 %
Platelets: 214 10*3/uL (ref 150–400)
RBC: 4.23 MIL/uL (ref 3.87–5.11)
RDW: 12.2 % (ref 11.5–15.5)
WBC: 11.1 10*3/uL — ABNORMAL HIGH (ref 4.0–10.5)
nRBC: 0 % (ref 0.0–0.2)

## 2022-09-08 LAB — I-STAT CHEM 8, ED
BUN: 11 mg/dL (ref 8–23)
Calcium, Ion: 0.88 mmol/L — CL (ref 1.15–1.40)
Chloride: 104 mmol/L (ref 98–111)
Creatinine, Ser: 0.5 mg/dL (ref 0.44–1.00)
Glucose, Bld: 443 mg/dL — ABNORMAL HIGH (ref 70–99)
HCT: 42 % (ref 36.0–46.0)
Hemoglobin: 14.3 g/dL (ref 12.0–15.0)
Potassium: 3.7 mmol/L (ref 3.5–5.1)
Sodium: 133 mmol/L — ABNORMAL LOW (ref 135–145)
TCO2: 22 mmol/L (ref 22–32)

## 2022-09-08 LAB — I-STAT VENOUS BLOOD GAS, ED
Acid-Base Excess: 1 mmol/L (ref 0.0–2.0)
Bicarbonate: 20.9 mmol/L (ref 20.0–28.0)
Calcium, Ion: 0.87 mmol/L — CL (ref 1.15–1.40)
HCT: 41 % (ref 36.0–46.0)
Hemoglobin: 13.9 g/dL (ref 12.0–15.0)
O2 Saturation: 100 %
Potassium: 3.8 mmol/L (ref 3.5–5.1)
Sodium: 133 mmol/L — ABNORMAL LOW (ref 135–145)
TCO2: 22 mmol/L (ref 22–32)
pCO2, Ven: 21.2 mmHg — ABNORMAL LOW (ref 44–60)
pH, Ven: 7.603 (ref 7.25–7.43)
pO2, Ven: 140 mmHg — ABNORMAL HIGH (ref 32–45)

## 2022-09-08 LAB — COMPREHENSIVE METABOLIC PANEL
ALT: 185 U/L — ABNORMAL HIGH (ref 0–44)
AST: 396 U/L — ABNORMAL HIGH (ref 15–41)
Albumin: 3.3 g/dL — ABNORMAL LOW (ref 3.5–5.0)
Alkaline Phosphatase: 125 U/L (ref 38–126)
Anion gap: 14 (ref 5–15)
BUN: 10 mg/dL (ref 8–23)
CO2: 22 mmol/L (ref 22–32)
Calcium: 8.8 mg/dL — ABNORMAL LOW (ref 8.9–10.3)
Chloride: 98 mmol/L (ref 98–111)
Creatinine, Ser: 0.96 mg/dL (ref 0.44–1.00)
GFR, Estimated: >60 mL/min (ref >60)
Glucose, Bld: 429 mg/dL — ABNORMAL HIGH (ref 70–99)
Potassium: 3.5 mmol/L (ref 3.5–5.1)
Sodium: 134 mmol/L — ABNORMAL LOW (ref 135–145)
Total Bilirubin: 2.4 mg/dL — ABNORMAL HIGH (ref 0.3–1.2)
Total Protein: 6.2 g/dL — ABNORMAL LOW (ref 6.5–8.1)

## 2022-09-08 LAB — GLUCOSE, CAPILLARY
Glucose-Capillary: 317 mg/dL — ABNORMAL HIGH (ref 70–99)
Glucose-Capillary: 295 mg/dL — ABNORMAL HIGH (ref 70–99)

## 2022-09-08 LAB — OSMOLALITY: Osmolality: 307 mOsm/kg — ABNORMAL HIGH (ref 275–295)

## 2022-09-08 LAB — CBG MONITORING, ED: Glucose-Capillary: 416 mg/dL — ABNORMAL HIGH (ref 70–99)

## 2022-09-08 LAB — LACTIC ACID, PLASMA: Lactic Acid, Venous: 1.4 mmol/L (ref 0.5–1.9)

## 2022-09-08 LAB — BETA-HYDROXYBUTYRIC ACID: Beta-Hydroxybutyric Acid: 0.43 mmol/L — ABNORMAL HIGH (ref 0.05–0.27)

## 2022-09-08 MED ORDER — PROCHLORPERAZINE EDISYLATE 10 MG/2ML IJ SOLN
5.0000 mg | INTRAMUSCULAR | Status: DC | PRN
  Administered 2022-09-08 – 2022-09-12 (×9): 5 mg via INTRAVENOUS
  Filled 2022-09-08 (×10): qty 2

## 2022-09-08 MED ORDER — ONDANSETRON HCL 4 MG/2ML IJ SOLN: 4.0000 mg | Freq: Once | INTRAMUSCULAR | Status: DC

## 2022-09-08 MED ORDER — ENOXAPARIN SODIUM 40 MG/0.4ML IJ SOSY
40.0000 mg | PREFILLED_SYRINGE | INTRAMUSCULAR | Status: DC
  Administered 2022-09-08 – 2022-09-12 (×5): 40 mg via SUBCUTANEOUS
  Filled 2022-09-08 (×5): qty 0.4

## 2022-09-08 MED ORDER — INSULIN ASPART 100 UNIT/ML IJ SOLN: 0.0000 [IU] | Freq: Three times a day (TID) | INTRAMUSCULAR | Status: DC

## 2022-09-08 MED ORDER — INSULIN ASPART 100 UNIT/ML IJ SOLN: 3.0000 [IU] | Freq: Three times a day (TID) | INTRAMUSCULAR | Status: DC

## 2022-09-08 MED ORDER — PANTOPRAZOLE SODIUM 40 MG IV SOLR: 40.0000 mg | Freq: Once | INTRAVENOUS | Status: DC

## 2022-09-08 MED ORDER — ONDANSETRON HCL 4 MG/2ML IJ SOLN
4.0000 mg | Freq: Once | INTRAMUSCULAR | Status: AC
  Administered 2022-09-08: 4 mg via INTRAVENOUS
  Filled 2022-09-08: qty 2

## 2022-09-08 MED ORDER — POLYETHYLENE GLYCOL 3350 17 G PO PACK
17.0000 g | PACK | Freq: Every day | ORAL | Status: DC
  Administered 2022-09-08 – 2022-09-11 (×4): 17 g via ORAL
  Filled 2022-09-08 (×5): qty 1

## 2022-09-08 MED ORDER — INSULIN ASPART 100 UNIT/ML IJ SOLN
0.0000 [IU] | Freq: Every day | INTRAMUSCULAR | Status: DC
  Administered 2022-09-08: 3 [IU] via SUBCUTANEOUS

## 2022-09-08 MED ORDER — ACETAMINOPHEN 325 MG PO TABS
650.0000 mg | ORAL_TABLET | Freq: Once | ORAL | Status: AC
  Administered 2022-09-08: 650 mg via ORAL
  Filled 2022-09-08: qty 2

## 2022-09-08 MED ORDER — SODIUM CHLORIDE 0.9 % IV BOLUS
1000.0000 mL | Freq: Once | INTRAVENOUS | Status: AC
  Administered 2022-09-08: 1000 mL via INTRAVENOUS

## 2022-09-08 MED ORDER — HYDROMORPHONE HCL 1 MG/ML IJ SOLN
0.5000 mg | Freq: Once | INTRAMUSCULAR | Status: DC
  Filled 2022-09-08: qty 1

## 2022-09-08 MED ORDER — IOHEXOL 350 MG/ML SOLN
75.0000 mL | Freq: Once | INTRAVENOUS | Status: AC | PRN
  Administered 2022-09-08: 75 mL via INTRAVENOUS

## 2022-09-08 MED ORDER — FENTANYL CITRATE PF 50 MCG/ML IJ SOSY
50.0000 ug | PREFILLED_SYRINGE | Freq: Once | INTRAMUSCULAR | Status: AC
  Administered 2022-09-08: 50 ug via INTRAVENOUS
  Filled 2022-09-08: qty 1

## 2022-09-08 MED ORDER — ACETAMINOPHEN 325 MG PO TABS
650.0000 mg | ORAL_TABLET | Freq: Four times a day (QID) | ORAL | Status: DC | PRN
  Administered 2022-09-09 – 2022-09-13 (×6): 650 mg via ORAL
  Filled 2022-09-08 (×7): qty 2

## 2022-09-08 MED ORDER — INSULIN ASPART 100 UNIT/ML IJ SOLN
0.0000 [IU] | Freq: Three times a day (TID) | INTRAMUSCULAR | Status: DC
  Administered 2022-09-08: 11 [IU] via SUBCUTANEOUS
  Administered 2022-09-09 – 2022-09-10 (×4): 5 [IU] via SUBCUTANEOUS
  Administered 2022-09-10: 3 [IU] via SUBCUTANEOUS
  Administered 2022-09-10: 5 [IU] via SUBCUTANEOUS
  Administered 2022-09-11: 3 [IU] via SUBCUTANEOUS
  Administered 2022-09-11: 5 [IU] via SUBCUTANEOUS
  Administered 2022-09-12 – 2022-09-13 (×4): 3 [IU] via SUBCUTANEOUS

## 2022-09-08 MED ORDER — SODIUM CHLORIDE 0.9 % IV BOLUS
500.0000 mL | Freq: Once | INTRAVENOUS | Status: AC
  Administered 2022-09-08: 500 mL via INTRAVENOUS

## 2022-09-08 MED ORDER — PROCHLORPERAZINE EDISYLATE 10 MG/2ML IJ SOLN
5.0000 mg | Freq: Once | INTRAMUSCULAR | Status: AC
  Administered 2022-09-08: 5 mg via INTRAVENOUS
  Filled 2022-09-08: qty 2

## 2022-09-08 MED ORDER — HYDROMORPHONE HCL 1 MG/ML IJ SOLN
0.5000 mg | Freq: Four times a day (QID) | INTRAMUSCULAR | Status: DC | PRN
  Administered 2022-09-08 – 2022-09-10 (×4): 0.5 mg via INTRAVENOUS
  Filled 2022-09-08 (×4): qty 0.5

## 2022-09-08 MED ORDER — OXYCODONE HCL 5 MG PO TABS
5.0000 mg | ORAL_TABLET | ORAL | Status: DC | PRN
  Filled 2022-09-08: qty 1

## 2022-09-08 MED ORDER — METRONIDAZOLE 500 MG/100ML IV SOLN
500.0000 mg | Freq: Once | INTRAVENOUS | Status: AC
  Administered 2022-09-08: 500 mg via INTRAVENOUS
  Filled 2022-09-08: qty 100

## 2022-09-08 MED ORDER — SODIUM CHLORIDE 0.9 % IV SOLN
2.0000 g | Freq: Once | INTRAVENOUS | Status: AC
  Administered 2022-09-08: 2 g via INTRAVENOUS
  Filled 2022-09-08: qty 20

## 2022-09-08 NOTE — Consult Note (Addendum)
Consultation  Referring Provider: ER MD [facility] Primary Care Physician:  Robin Frees, NP Primary Gastroenterologist:  Dr.Jacobs previously  Reason for Consultation: Acute severe epigastric pain  HPI: Robin Tucker is a 72 y.o. female who presented to the emergency room this morning after she was awakened about 4 AM with epigastric pain that was severe and associated with nausea and vomiting.  Per the notes she had a similar episode a few days ago but much more mild.  She says this pain was at least a 9 out of 10 and she thought she might die. Pain has lasted multiple hours, has been constant but is not quite as bad currently.  She has had pain medicine in the ER.  She is still quite uncomfortable sitting straight up in the bed.  She denies any radiation to her back or into her chest.  She is still nauseated but has not been vomiting, no documented fever or chills. Workup in the ER with CT of the abdomen pelvis with contrast shows hepatic steatosis, no gallstones or inflammatory changes noted, no biliary ductal dilation, there is a 3.8 cm uterine mass consistent with a fibroid. Upper abdominal ultrasound showed no gallstones, there is equivocal gallbladder wall thickening and a CBD of 3.3 mm.  Labs with WBC of 11, hemoglobin 14/hematocrit 40/MCV of 95 Lipase 57 Sodium 130/potassium 3.5/443 T. bili 2.4/alk phos 125/AST 396/ALT 185 Lactate 2.6  Patient has been seen by surgery, and HIDA scan ordered.  Notes patient has not seen a physician since 2021, she does have history of endometriosis, anxiety/depression, prior C-sections and had colonoscopy in 2021 for Dr. Christella Hartigan with finding one 2 mm polyp in the ascending colon otherwise normal exam, path on the polyp consistent with hyperplastic polyp.   Past Medical History:  Diagnosis Date   Anxiety    Chicken pox 1954   Colon polyps    Depression    Endometriosis    Vertigo     Past Surgical History:  Procedure Laterality  Date   ABLATION ON ENDOMETRIOSIS     CESAREAN SECTION     per pt 4   COLONOSCOPY  2016   POLYPECTOMY      Prior to Admission medications   Medication Sig Start Date End Date Taking? Authorizing Provider  ASPIRIN 81 PO Take 324 mg by mouth once as needed (chest pain).   Yes [provider]  diazepam (VALIUM) 5 MG tablet Take 1 tablet (5 mg total) by mouth every 12 (twelve) hours as needed. 04/14/19  Yes Nafziger, Kandee Keen, NP  ibuprofen (ADVIL) 200 MG tablet Take 200 mg by mouth daily as needed for headache or moderate pain.   Yes [provider]    Current Facility-Administered Medications  Medication Dose Route Frequency Provider Last Rate Last Admin   enoxaparin (LOVENOX) injection 40 mg  40 mg Subcutaneous Q24H Patel, Amar, DO       HYDROmorphone (DILAUDID) injection 0.5 mg  0.5 mg Intravenous Once Henderly, Britni A, PA-C       HYDROmorphone (DILAUDID) injection 0.5 mg  0.5 mg Intravenous Q6H PRN Modena Slater, DO       ondansetron (ZOFRAN) injection 4 mg  4 mg Intravenous Once Kathleen Lime, MD       oxyCODONE (Oxy IR/ROXICODONE) immediate release tablet 5 mg  5 mg Oral Q4H PRN Modena Slater, DO       pantoprazole (PROTONIX) injection 40 mg  40 mg Intravenous Once Henderly, Britni A, PA-C  polyethylene glycol (MIRALAX / GLYCOLAX) packet 17 g  17 g Oral Daily Modena Slater, DO       Current Outpatient Medications  Medication Sig Dispense Refill   ASPIRIN 81 PO Take 324 mg by mouth once as needed (chest pain).     diazepam (VALIUM) 5 MG tablet Take 1 tablet (5 mg total) by mouth every 12 (twelve) hours as needed. 30 tablet 0   ibuprofen (ADVIL) 200 MG tablet Take 200 mg by mouth daily as needed for headache or moderate pain.      Allergies as of 09/08/2022 - Review Complete 09/08/2022  Allergen Reaction Noted   Codeine Hives 06/26/2006    Family History  Problem Relation Age of Onset   Heart disease Mother    Colon cancer Mother    Arthritis Mother     Depression Mother    Heart attack Mother    Hyperlipidemia Mother    Hypertension Mother    Mental illness Mother    Miscarriages / [place] Mother    Stroke Mother    Colon polyps Mother    Leukemia Father    Hypertension Father    Hyperlipidemia Father    Depression Sister    Hypertension Sister    Stroke Sister    Cancer Maternal Grandfather    Stroke Paternal Grandmother    Heart attack Paternal Grandfather    Esophageal cancer Neg Hx    Rectal cancer Neg Hx    Stomach cancer Neg Hx     Social History   Socioeconomic History   Marital status: Single    Spouse name: Not on file   Number of children: Not on file   Years of education: college   Highest education level: Not on file  Occupational History   Occupation: retired  Tobacco Use   Smoking status: Never   Smokeless tobacco: Never  Vaping Use   Vaping status: Never Used  Substance and Sexual Activity   Alcohol use: No    Alcohol/week: 0.0 standard drinks of alcohol    Comment: occasional   Drug use: No   Sexual activity: Not Currently  Other Topics Concern   Not on file  Social History Narrative   Has 4 children    One child lives with her.    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  All other review of systems was otherwise negative.   Physical Exam: Vital signs in last 24 hours: Temp:  [98.5 F (36.9 C)-98.8 F (37.1 C)] 98.8 F (37.1 C) (08/17 1133) Pulse Rate:  [105-115] 108 (08/17 1200) Resp:  [14-20] 14 (08/17 1200) BP: (124-141)/(62-71) 129/64 (08/17 1200) SpO2:  [93 %-98 %] 93 % (08/17 1200) Weight:   [93.9 kg] 93.9 kg (08/17 0727)   General:   Alert,  Well-developed, well-nourished obese older white female, cooperative in NAD, appears very uncomfortable, sitting straight up in bed, unhappy with her care in the ER Head:  Normocephalic and atraumatic. Eyes:  Sclera clear, no icterus.   Conjunctiva pink. Ears:  Normal auditory acuity. Nose:  No deformity,  discharge,  or lesions. Mouth:  No deformity or lesions.   Neck:  Supple; no masses or thyromegaly. Lungs:  Clear throughout to auscultation.   No wheezes, crackles, or rhonchi.  Heart:  Regular rate and rhythm; no murmurs, clicks, rubs,  or gallops. Abdomen:  Soft, obese, tender in the epigastrium, no rebound BS present, no palp mass or hsm.   Rectal: Not done Msk:  Symmetrical without gross deformities. . Pulses:  Normal pulses noted. Extremities:  Without clubbing or edema. Neurologic:  Alert and  oriented x4;  grossly normal neurologically. Skin:  Intact without significant lesions or rashes.. Psych:  Alert and cooperative.  Irritable  Intake/Output from previous day: No intake/output data recorded. Intake/Output this shift: Total I/O In: 2081.6 [IV Piggyback:2081.6] Out: -   Lab Results: Recent Labs    09/08/22 0757 09/08/22 0816  WBC  --  11.1*  HGB 13.9  14.3 14.4  HCT 41.0  42.0 40.5  PLT  --  214   BMET Recent Labs    09/08/22 0757 09/08/22 0816  NA 133*  133* 134*  K 3.8  3.7 3.5  CL 104 98  CO2  --  22  GLUCOSE 443* 429*  BUN 11 10  CREATININE 0.50 0.96  CALCIUM  --  8.8*   LFT Recent Labs    09/08/22 0816  PROT 6.2*  ALBUMIN 3.3*  AST 396*  ALT 185*  ALKPHOS 125  BILITOT 2.4*   PT/INR No results for input(s): "LABPROT", "INR" in the last 72 hours. Hepatitis Panel No results for input(s): "HEPBSAG", "HCVAB", "HEPAIGM", "HEPBIGM" in the last 72 hours.    IMPRESSION:  #67 72 year old white female who presents to the emergency room after acute onset of severe epigastric pain around 4  AM this morning, associated with nausea, then vomiting.  No documented fever or chills, no diarrhea, no hematemesis.  Workup with CT scan and abdominal ultrasound, she does have hepatic steatosis, no gallstones noted on CT or ultrasound, ultrasound raising question of equivocal gallbladder wall thickening, CBD of 3.3 mm.  Leukocytosis and elevated lactate at 2.6, Elevated LFTs-T. bili 2.4/AST 396/ALT 185  Workup most concerning at this point for possible acalculous cholecystitis  #2 severe hyperglycemia and patient not known to be diabetic-assistant with untreated type 2 diabetes  #3  3.8 centimeter uterine fibroid #4, obesity #5.  History of hyperplastic colon polyp last colonoscopy 2021 #4  obesity  PLAN: HIDA scan has been ordered stat, nuclear medicine tech on call had to be contacted via the ER, told that tech only on-call till 3 PM and that HIDA scan would not be done until tomorrow. Okay to allow clear liquids, then n.p.o. after midnight Will not be able to have any pain medication for 6 hours prior to the HIDA scan, okay for analgesics in the interval Repeat  CBC with differential, lactate and c-Met in a.m. Patient is being covered with Rocephin and metronidazole GI will follow with you    Athan Casalino EsterwoodPA-C  09/08/2022, 2:54 PM

## 2022-09-08 NOTE — ED Provider Notes (Signed)
I provided a substantive portion of the care of this patient.  I personally made/approved the management plan for this patient and take responsibility for the patient management.  EKG Interpretation Date/Time:  Saturday September 08 2022 07:29:56 EDT Ventricular Rate:  113 PR Interval:  164 QRS Duration:  86 QT Interval:  339 QTC Calculation: 465 R Axis:   75  Text Interpretation: Sinus tachycardia Biatrial enlargement Low voltage, precordial leads Minimal ST depression, inferior leads no sig change from previous Confirmed by Arby Barrette [phone]) on 09/08/2022 8:02:29 AM   Patient reports she woke at 4 AM with severe pain in her epigastrium and central chest.  She reports she was intensely nauseated and started vomiting.  Pain radiates to the back.  Patient denies history of similar pain.  She reports she felt well in the evening and had ravioli for dinner at about 9 PM.  No syncope no shortness of breath.  Patient is alert and nontoxic.  She appears to be in significant pain.  No respiratory distress.  Heart regular tachycardia.  Lungs grossly clear.  Tender right upper quadrant and epigastrium.  No lower extremity edema.  Calves soft and nontender.  Agree with plan of management.   Arby Barrette, MD 09/08/22 [phone]

## 2022-09-08 NOTE — ED Notes (Signed)
Pt refusing covid swab, zofran, and protonix. EDP notified.

## 2022-09-08 NOTE — ED Notes (Signed)
ED TO INPATIENT HANDOFF REPORT  ED Nurse Name and Phone #:  Corliss Blacker, RN [phone]  S Name/Age/Gender Robin Tucker 72 y.o. female Room/Bed: 018C/018C  Code Status   Code Status: Full Code  Home/SNF/Other Home Patient oriented to: self, place, time, and situation Is this baseline? Yes   Triage Complete: Triage complete  Chief Complaint Epigastric pain [R10.13]  Triage Note Pt [facility] from home. Pt woke up at 4am with epigastric pain that's been going on since then with nausea and vomiting. Similar episode a few days ago but pt says it was not as bad. EMS states pt has no medical in hx, pt has not been to doctor since 2016. Sinus tach 12Lead. Pt is oriented and ambulatory. Pt endorses chest pain during triage, states "it's not really a pain, it's more of an ache." Pt very anxious.   EMS vital signs:  114/76 93% on ra  367 cbg  20L forearm   324 aspirin at home   EMS given: 1 nitroglycerin 4mg  IV zofran    Allergies Allergies  Allergen Reactions   Codeine Hives    Level of Care/Admitting Diagnosis ED Disposition     ED Disposition  Admit   Condition  --   Comment  Hospital Area: [facility] [[id number]]  Level of Care: Med-Surg [[id number]]  May admit patient to [facility] or [facility] if equivalent level of care is available:: No  Covid Evaluation: Asymptomatic - no recent exposure (last 10 days) testing not required  Diagnosis: Epigastric pain [454098]  Admitting Physician: Miguel Aschoff [[id number]]  Attending Physician: Miguel Aschoff [[id number]]  Certification:: I certify this patient will need inpatient services for at least 2 midnights  Expected Medical Readiness: 09/11/2022          B Medical/Surgery History Past Medical History:  Diagnosis Date   Anxiety    Chicken pox 1954   Colon polyps    Depression    Endometriosis    Vertigo    Past Surgical History:  Procedure Laterality Date   ABLATION ON ENDOMETRIOSIS      CESAREAN SECTION     per pt 4   COLONOSCOPY  2016   POLYPECTOMY       A IV Location/Drains/Wounds Patient Lines/Drains/Airways Status     Active Line/Drains/Airways     Name Placement date Placement time Site Days   Peripheral IV 01/02/14 Right;Distal Wrist 01/02/14  1222  Wrist  3171   Peripheral IV 09/08/22 20 G Anterior;Left Forearm 09/08/22  0757  Forearm  less than 1   Peripheral IV 09/08/22 18 G Anterior;Right Forearm 09/08/22  0807  Forearm  less than 1            Intake/Output Last 24 hours  Intake/Output Summary (Last 24 hours) at 09/08/2022 1441 Last data filed at 09/08/2022 1129 Gross per 24 hour  Intake 2081.61 ml  Output --  Net 2081.61 ml    Labs/Imaging Results for orders placed or performed during the hospital encounter of 09/08/22 (from the past 48 hour(s))  POC CBG, ED     Status: Abnormal   Collection Time: 09/08/22  7:47 AM  Result Value Ref Range   Glucose-Capillary 416 (H) 70 - 99 mg/dL    Comment: Glucose reference range applies only to samples taken after fasting for at least 8 hours.  I-Stat CG4 Lactic Acid     Status: Abnormal   Collection Time: 09/08/22  7:57 AM  Result Value Ref  Range   Lactic Acid, Venous 3.9 (HH) 0.5 - 1.9 mmol/L   Comment NOTIFIED PHYSICIAN   I-stat chem 8, ED (not at [facility], [facility] or [facility])     Status: Abnormal   Collection Time: 09/08/22  7:57 AM  Result Value Ref Range   Sodium 133 (L) 135 - 145 mmol/L   Potassium 3.7 3.5 - 5.1 mmol/L   Chloride 104 98 - 111 mmol/L   BUN 11 8 - 23 mg/dL   Creatinine, Ser 1.61 0.44 - 1.00 mg/dL   Glucose, Bld 096 (H) 70 - 99 mg/dL    Comment: Glucose reference range applies only to samples taken after fasting for at least 8 hours.   Calcium, Ion 0.88 (LL) 1.15 - 1.40 mmol/L   TCO2 22 22 - 32 mmol/L   Hemoglobin 14.3 12.0 - 15.0 g/dL   HCT 04.5 40.9 - 81.1 %   Comment NOTIFIED PHYSICIAN   I-Stat venous blood gas, ([facility], [facility], [facility])     Status: Abnormal   Collection Time: 09/08/22   7:57 AM  Result Value Ref Range   pH, Ven 7.603 (HH) 7.25 - 7.43   pCO2, Ven 21.2 (L) 44 - 60 mmHg   pO2, Ven 140 (H) 32 - 45 mmHg   Bicarbonate 20.9 20.0 - 28.0 mmol/L   TCO2 22 22 - 32 mmol/L   O2 Saturation 100 %   Acid-Base Excess 1.0 0.0 - 2.0 mmol/L   Sodium 133 (L) 135 - 145 mmol/L   Potassium 3.8 3.5 - 5.1 mmol/L   Calcium, Ion 0.87 (LL) 1.15 - 1.40 mmol/L   HCT 41.0 36.0 - 46.0 %   Hemoglobin 13.9 12.0 - 15.0 g/dL   Sample type VENOUS    Comment NOTIFIED PHYSICIAN   CBC with Differential     Status: Abnormal   Collection Time: 09/08/22  8:16 AM  Result Value Ref Range   WBC 11.1 (H) 4.0 - 10.5 K/uL   RBC 4.23 3.87 - 5.11 MIL/uL   Hemoglobin 14.4 12.0 - 15.0 g/dL   HCT 91.4 78.2 - 95.6 %   MCV 95.7 80.0 - 100.0 fL   MCH 34.0 26.0 - 34.0 pg   MCHC 35.6 30.0 - 36.0 g/dL   RDW 21.3 08.6 - 57.8 %   Platelets 214 150 - 400 K/uL   nRBC 0.0 0.0 - 0.2 %   Neutrophils Relative % 91 %   Neutro Abs 10.1 (H) 1.7 - 7.7 K/uL   Lymphocytes Relative 4 %   Lymphs Abs 0.4 (L) 0.7 - 4.0 K/uL   Monocytes Relative 5 %   Monocytes Absolute 0.5 0.1 - 1.0 K/uL   Eosinophils Relative 0 %   Eosinophils Absolute 0.0 0.0 - 0.5 K/uL   Basophils Relative 0 %   Basophils Absolute 0.0 0.0 - 0.1 K/uL   Immature Granulocytes 0 %   Abs Immature Granulocytes 0.04 0.00 - 0.07 K/uL    Comment: Performed at [facility], [address], [place], [place] [address]  Comprehensive metabolic panel     Status: Abnormal   Collection Time: 09/08/22  8:16 AM  Result Value Ref Range   Sodium 134 (L) 135 - 145 mmol/L   Potassium 3.5 3.5 - 5.1 mmol/L   Chloride 98 98 - 111 mmol/L   CO2 22 22 - 32 mmol/L   Glucose, Bld 429 (H) 70 - 99 mg/dL    Comment: Glucose reference range applies only to samples taken after fasting for at  least 8 hours.   BUN 10 8 - 23 mg/dL   Creatinine, Ser 7.25 0.44 - 1.00 mg/dL   Calcium 8.8 (L) 8.9 - 10.3 mg/dL   Total Protein 6.2 (L) 6.5 - 8.1 g/dL   Albumin 3.3 (L) 3.5 -  5.0 g/dL   AST 366 (H) 15 - 41 U/L   ALT 185 (H) 0 - 44 U/L   Alkaline Phosphatase 125 38 - 126 U/L   Total Bilirubin 2.4 (H) 0.3 - 1.2 mg/dL   GFR, Estimated >44 >03 mL/min    Comment: (NOTE) Calculated using the CKD-EPI Creatinine Equation (2021)    Anion gap 14 5 - 15    Comment: Performed at [facility], [address] [place], [place] [address]  Lipase, blood     Status: Abnormal   Collection Time: 09/08/22  8:16 AM  Result Value Ref Range   Lipase 57 (H) 11 - 51 U/L    Comment: Performed at [facility], [address] [place], [place] [address]  Troponin I (High Sensitivity)     Status: None   Collection Time: 09/08/22  8:16 AM  Result Value Ref Range   Troponin I (High Sensitivity) 12 <18 ng/L    Comment: (NOTE) Elevated high sensitivity troponin I (hsTnI) values and significant  changes across serial measurements may suggest ACS but many other  chronic and acute conditions are known to elevate hsTnI results.  Refer to the "Links" section for chest pain algorithms and additional  guidance. Performed at [facility], [address], [place], [place] [address]   Beta-hydroxybutyric acid     Status: Abnormal   Collection Time: 09/08/22  8:16 AM  Result Value Ref Range   Beta-Hydroxybutyric Acid 0.43 (H) 0.05 - 0.27 mmol/L    Comment: Performed at [facility], [address] [place], [place] [address]  Urinalysis, Routine w reflex microscopic -Urine, Clean Catch     Status: Abnormal   Collection Time: 09/08/22  9:38 AM  Result Value Ref Range   Color, Urine YELLOW YELLOW   APPearance CLEAR CLEAR   Specific Gravity, Urine >1.046 (H) 1.005 - 1.030   pH 7.0 5.0 - 8.0   Glucose, UA >=500 (A) NEGATIVE mg/dL   Hgb urine dipstick NEGATIVE NEGATIVE   Bilirubin Urine NEGATIVE NEGATIVE   Ketones, ur 20 (A) NEGATIVE mg/dL   Protein, ur NEGATIVE NEGATIVE mg/dL   Nitrite NEGATIVE NEGATIVE   Leukocytes,Ua NEGATIVE NEGATIVE   RBC / HPF 0-5 0 - 5  RBC/hpf   WBC, UA 0-5 0 - 5 WBC/hpf   Bacteria, UA NONE SEEN NONE SEEN   Squamous Epithelial / HPF 0-5 0 - 5 /HPF    Comment: Performed at [facility], [address], [place], [place] [address]  Troponin I (High Sensitivity)     Status: None   Collection Time: 09/08/22  9:52 AM  Result Value Ref Range   Troponin I (High Sensitivity) 13 <18 ng/L    Comment: (NOTE) Elevated high sensitivity troponin I (hsTnI) values and significant  changes across serial measurements may suggest ACS but many other  chronic and acute conditions are known to elevate hsTnI results.  Refer to the "Links" section for chest pain algorithms and additional  guidance. Performed at [facility], [address] [place], [place] [address]   I-Stat CG4 Lactic Acid     Status: Abnormal   Collection Time: 09/08/22 10:23 AM  Result  Value Ref Range   Lactic Acid, Venous 2.6 (HH) 0.5 - 1.9 mmol/L   Comment NOTIFIED PHYSICIAN    US Abdomen Limited RUQ (LIVER/GB)  Result Date: 09/08/2022 CLINICAL DATA:  Elevated LFTs and epigastric pain. EXAM: ULTRASOUND ABDOMEN LIMITED RIGHT UPPER QUADRANT COMPARISON:  CT from earlier today FINDINGS: Gallbladder: No gallstones, gallbladder sludge, or pericholecystic fluid. Equivocal wall thickening measuring between 2 and 3 mm. Positive sonographic Murphy's sign reported. Common bile duct: Diameter: 3.3 mm Liver: Did no focal lesion identified. Increased parenchymal echogenicity of the liver suggesting hepatic steatosis. Portal vein is patent on color Doppler imaging with normal direction of blood flow towards the liver. Other: None. IMPRESSION: 1. No gallstones or gallbladder sludge. Equivocal gallbladder wall thickening. Positive sonographic Murphy's sign reported. Imaging findings are nonspecific. If there is a high clinical concern for acute cholecystitis consider further investigation with nuclear medicine hepatic biliary scan. 2. Hepatic steatosis. Electronically Signed    By: Signa Kell M.D.   On: 09/08/2022 10:31   CT Angio Chest PE W and/or Wo Contrast  Result Date: 09/08/2022 CLINICAL DATA:  Epigastric pain, concern for pulmonary embolism. EXAM: CT ANGIOGRAPHY CHEST CT ABDOMEN AND PELVIS WITH CONTRAST TECHNIQUE: Multidetector CT imaging of the chest was performed using the standard protocol during bolus administration of intravenous contrast. Multiplanar CT image reconstructions and MIPs were obtained to evaluate the vascular anatomy. Multidetector CT imaging of the abdomen and pelvis was performed using the standard protocol during bolus administration of intravenous contrast. RADIATION DOSE REDUCTION: This exam was performed according to the departmental dose-optimization program which includes automated exposure control, adjustment of the mA and/or kV according to patient size and/or use of iterative reconstruction technique. CONTRAST:  75mL OMNIPAQUE IOHEXOL 350 MG/ML SOLN COMPARISON:  None Available. FINDINGS: CTA CHEST FINDINGS Cardiovascular: Satisfactory opacification of the pulmonary arteries to the segmental level. No evidence of pulmonary embolism. The heart is mildly enlarged. No pericardial effusion. Mediastinum/Nodes: No enlarged mediastinal, hilar, or axillary lymph nodes. Thyroid gland, trachea, and esophagus demonstrate no significant findings. Lungs/Pleura: There is mild-to-moderate bilateral atelectasis. No pleural effusion or pneumothorax. Musculoskeletal: Degenerative changes are seen in the spine. Review of the MIP images confirms the above findings. CT ABDOMEN and PELVIS FINDINGS Hepatobiliary: The liver is diffusely hypoattenuating, consistent with hepatic steatosis. No focal liver abnormality is seen. No gallstones, gallbladder wall thickening, or biliary dilatation. Pancreas: Unremarkable. No pancreatic ductal dilatation or surrounding inflammatory changes. Spleen: Normal in size without focal abnormality. Adrenals/Urinary Tract: Adrenal glands  are unremarkable. Kidneys are normal, without renal calculi, focal lesion, or hydronephrosis. Bladder is unremarkable. Stomach/Bowel: Stomach is within normal limits. Appendix appears normal. No evidence of bowel wall thickening, distention, or inflammatory changes. Vascular/Lymphatic: Aortic atherosclerosis. No enlarged abdominal or pelvic lymph nodes. Reproductive: A uterine mass measuring 3.8 cm likely represents a fibroid. No adnexal mass. Other: No abdominal wall hernia or abnormality. No abdominopelvic ascites. Musculoskeletal: Degenerative changes are seen in the spine. Review of the MIP images confirms the above findings. IMPRESSION: 1. No evidence of pulmonary embolism. 2. No acute findings in the chest, abdomen or pelvis. 3. Hepatic steatosis. 4. Uterine mass likely represents a fibroid. Aortic Atherosclerosis (ICD10-I70.0). Electronically Signed   By: Romona Curls M.D.   On: 09/08/2022 09:27   CT ABDOMEN PELVIS W CONTRAST  Result Date: 09/08/2022 CLINICAL DATA:  Epigastric pain, concern for pulmonary embolism. EXAM: CT ANGIOGRAPHY CHEST CT ABDOMEN AND PELVIS WITH CONTRAST TECHNIQUE: Multidetector CT imaging of the chest was performed using the standard protocol  during bolus administration of intravenous contrast. Multiplanar CT image reconstructions and MIPs were obtained to evaluate the vascular anatomy. Multidetector CT imaging of the abdomen and pelvis was performed using the standard protocol during bolus administration of intravenous contrast. RADIATION DOSE REDUCTION: This exam was performed according to the departmental dose-optimization program which includes automated exposure control, adjustment of the mA and/or kV according to patient size and/or use of iterative reconstruction technique. CONTRAST:  75mL OMNIPAQUE IOHEXOL 350 MG/ML SOLN COMPARISON:  None Available. FINDINGS: CTA CHEST FINDINGS Cardiovascular: Satisfactory opacification of the pulmonary arteries to the segmental level. No  evidence of pulmonary embolism. The heart is mildly enlarged. No pericardial effusion. Mediastinum/Nodes: No enlarged mediastinal, hilar, or axillary lymph nodes. Thyroid gland, trachea, and esophagus demonstrate no significant findings. Lungs/Pleura: There is mild-to-moderate bilateral atelectasis. No pleural effusion or pneumothorax. Musculoskeletal: Degenerative changes are seen in the spine. Review of the MIP images confirms the above findings. CT ABDOMEN and PELVIS FINDINGS Hepatobiliary: The liver is diffusely hypoattenuating, consistent with hepatic steatosis. No focal liver abnormality is seen. No gallstones, gallbladder wall thickening, or biliary dilatation. Pancreas: Unremarkable. No pancreatic ductal dilatation or surrounding inflammatory changes. Spleen: Normal in size without focal abnormality. Adrenals/Urinary Tract: Adrenal glands are unremarkable. Kidneys are normal, without renal calculi, focal lesion, or hydronephrosis. Bladder is unremarkable. Stomach/Bowel: Stomach is within normal limits. Appendix appears normal. No evidence of bowel wall thickening, distention, or inflammatory changes. Vascular/Lymphatic: Aortic atherosclerosis. No enlarged abdominal or pelvic lymph nodes. Reproductive: A uterine mass measuring 3.8 cm likely represents a fibroid. No adnexal mass. Other: No abdominal wall hernia or abnormality. No abdominopelvic ascites. Musculoskeletal: Degenerative changes are seen in the spine. Review of the MIP images confirms the above findings. IMPRESSION: 1. No evidence of pulmonary embolism. 2. No acute findings in the chest, abdomen or pelvis. 3. Hepatic steatosis. 4. Uterine mass likely represents a fibroid. Aortic Atherosclerosis (ICD10-I70.0). Electronically Signed   By: Romona Curls M.D.   On: 09/08/2022 09:27    Pending Labs Unresulted Labs (From admission, onward)     Start     Ordered   09/09/22 0500  Triglycerides  Tomorrow morning,   R        09/08/22 1426   09/09/22  0500  Hemoglobin A1c  Tomorrow morning,   R        09/08/22 1426   09/09/22 0500  Lipid panel  Tomorrow morning,   R        09/08/22 1426   09/09/22 0500  Bilirubin, fractionated(tot/dir/indir)  Tomorrow morning,   R        09/08/22 1426   09/09/22 0500  Comprehensive metabolic panel  Tomorrow morning,   R        09/08/22 1426   09/09/22 0500  Hepatitis panel, acute  Tomorrow morning,   R        09/08/22 1426   09/09/22 0500  HIV Antibody (routine testing w rflx)  (HIV Antibody (Routine testing w reflex) panel)  Tomorrow morning,   R        09/08/22 1426   09/08/22 0735  Blood culture (routine x 2)  BLOOD CULTURE X 2,   R (with STAT occurrences)      09/08/22 0734   09/08/22 0735  SARS Coronavirus 2 by RT PCR (hospital order, performed in [facility] hospital lab) *cepheid single result test* Anterior Nasal Swab  (Tier 2 - SARS Coronavirus 2 by RT PCR (hospital order, performed in [facility] hospital lab) *cepheid  single result test*)  Once,   URGENT        09/08/22 0734            Vitals/Pain Today's Vitals   09/08/22 1130 09/08/22 1133 09/08/22 1200 09/08/22 1225  BP: 133/67  129/64   Pulse: (!) 105 (!) 107 (!) 108   Resp: 18 19 14    Temp:  98.8 F (37.1 C)    TempSrc:      SpO2: 96% 95% 93%   Weight:      Height:      PainSc:  2   6     Isolation Precautions Airborne and Contact precautions  Medications Medications  pantoprazole (PROTONIX) injection 40 mg (40 mg Intravenous Patient Refused/Not Given 09/08/22 0752)  HYDROmorphone (DILAUDID) injection 0.5 mg (0.5 mg Intravenous Patient Refused/Not Given 09/08/22 1358)  ondansetron (ZOFRAN) injection 4 mg (has no administration in time range)  enoxaparin (LOVENOX) injection 40 mg (has no administration in time range)  oxyCODONE (Oxy IR/ROXICODONE) immediate release tablet 5 mg (has no administration in time range)  polyethylene glycol (MIRALAX / GLYCOLAX) packet 17 g (has no administration in time range)  HYDROmorphone  (DILAUDID) injection 0.5 mg (has no administration in time range)  sodium chloride 0.9 % bolus 1,000 mL (0 mLs Intravenous Stopped 09/08/22 1015)  fentaNYL (SUBLIMAZE) injection 50 mcg (50 mcg Intravenous Given 09/08/22 0808)  sodium chloride 0.9 % bolus 1,000 mL (0 mLs Intravenous Stopped 09/08/22 1129)  iohexol (OMNIPAQUE) 350 MG/ML injection 75 mL (75 mLs Intravenous Contrast Given 09/08/22 0839)  cefTRIAXone (ROCEPHIN) 2 g in sodium chloride 0.9 % 100 mL IVPB (0 g Intravenous Stopped 09/08/22 1022)  metroNIDAZOLE (FLAGYL) IVPB 500 mg (500 mg Intravenous New Bag/Given 09/08/22 1132)  fentaNYL (SUBLIMAZE) injection 50 mcg (50 mcg Intravenous Given 09/08/22 1140)  ondansetron (ZOFRAN) injection 4 mg (4 mg Intravenous Given 09/08/22 1140)  acetaminophen (TYLENOL) tablet 650 mg (650 mg Oral Given 09/08/22 1225)    Mobility walks with person assist     Focused Assessments    R Recommendations: See Admitting Provider Note  Report given to:   Additional Notes:

## 2022-09-08 NOTE — ED Triage Notes (Addendum)
Pt [facility] from home. Pt woke up at 4am with epigastric pain that's been going on since then with nausea and vomiting. Similar episode a few days ago but pt says it was not as bad. EMS states pt has no medical in hx, pt has not been to doctor since 2016. Sinus tach 12Lead. Pt is oriented and ambulatory. Pt endorses chest pain during triage, states "it's not really a pain, it's more of an ache." Pt very anxious.   EMS vital signs:  114/76 93% on ra  367 cbg  20L forearm   324 aspirin at home   EMS given: 1 nitroglycerin 4mg  IV zofran

## 2022-09-08 NOTE — ED Notes (Signed)
Pt states she does not want a blood transfusion/products if deemed necessary for her care. Pt states "I do not want any of that contaminated, vaccinated blood."

## 2022-09-08 NOTE — ED Notes (Signed)
[facility] states they are not able to do study due to patient having pain medication within past 6 hours. States they will have to reschedule for tomorrow. Provider notified of situation.

## 2022-09-08 NOTE — Consult Note (Addendum)
Consulting Physician: Hyman Hopes Devi Hopman  Referring Provider: Arby Barrette, MD   Chief Complaint: Chest pain  Reason for Consult: Possible cholecystitis   Subjective   HPI: Robin Tucker is an 72 y.o. female who is here for chest pain nausea and vomiting.  She had some pain about a week ago or so, but it was not really accompanied with nausea or vomiting and passed.  She woke up at 4 AM with severe central chest pain at the lower chest upper abdomen.  She had nausea and vomiting.  The pain radiates into her back.  She had ravioli last night for dinner.  She appears very uncomfortable.  She says even if she has cholecystitis she does not want to have surgery, but she is okay completing the workup to better understand exactly what is causing her symptoms.  Past Medical History:  Diagnosis Date   Anxiety    Chicken pox 1954   Colon polyps    Depression    Endometriosis    Vertigo     Past Surgical History:  Procedure Laterality Date   ABLATION ON ENDOMETRIOSIS     CESAREAN SECTION     per pt 4   COLONOSCOPY  2016   POLYPECTOMY      Family History  Problem Relation Age of Onset   Heart disease Mother    Colon cancer Mother    Arthritis Mother    Depression Mother    Heart attack Mother    Hyperlipidemia Mother    Hypertension Mother    Mental illness Mother    Miscarriages / [place] Mother    Stroke Mother    Colon polyps Mother    Leukemia Father    Hypertension Father    Hyperlipidemia Father    Depression Sister    Hypertension Sister    Stroke Sister    Cancer Maternal Grandfather    Stroke Paternal Grandmother    Heart attack Paternal Grandfather    Esophageal cancer Neg Hx    Rectal cancer Neg Hx    Stomach cancer Neg Hx     Social:  reports that she has never smoked. She has never used smokeless tobacco. She reports that she does not drink alcohol and does not use drugs.  Allergies:  Allergies  Allergen Reactions   Codeine Hives     Medications: Current Outpatient Medications  Medication Instructions   ASPIRIN 81 PO 324 mg, Oral, Once PRN   diazepam (VALIUM) 5 mg, Oral, Every 12 hours PRN   ibuprofen (ADVIL) 200 mg, Oral, Daily PRN    ROS - all of the below systems have been reviewed with the patient and positives are indicated with bold text General: chills, fever or night sweats Eyes: blurry vision or double vision ENT: epistaxis or sore throat Allergy/Immunology: itchy/watery eyes or nasal congestion Hematologic/Lymphatic: bleeding problems, blood clots or swollen lymph nodes Endocrine: temperature intolerance or unexpected weight changes Breast: new or changing breast lumps or nipple discharge Resp: cough, shortness of breath, or wheezing CV: chest pain or dyspnea on exertion GI: as per HPI GU: dysuria, trouble voiding, or hematuria MSK: joint pain or joint stiffness Neuro: TIA or stroke symptoms Derm: pruritus and skin lesion changes Psych: anxiety and depression  Objective   PE Blood pressure 129/64, pulse (!) 108, temperature 98.8 F (37.1 C), resp. rate 14, height 5\' 4"  (1.626 m), weight 93.9 kg, SpO2 93%. Constitutional: NAD; conversant; no deformities Eyes: Moist conjunctiva; no lid lag; anicteric; PERRL Neck:  Trachea midline; no thyromegaly Lungs: Normal respiratory effort; no tactile fremitus CV: RRR; no palpable thrills; no pitting edema GI: Abd soft, mild tenderness in the epigastric area; no palpable hepatosplenomegaly MSK: Normal range of motion of extremities; no clubbing/cyanosis Psychiatric: Appropriate affect; alert and oriented x3 Lymphatic: No palpable cervical or axillary lymphadenopathy  Results for orders placed or performed during the hospital encounter of 09/08/22 (from the past 24 hour(s))  POC CBG, ED     Status: Abnormal   Collection Time: 09/08/22  7:47 AM  Result Value Ref Range   Glucose-Capillary 416 (H) 70 - 99 mg/dL  I-Stat CG4 Lactic Acid     Status:  Abnormal   Collection Time: 09/08/22  7:57 AM  Result Value Ref Range   Lactic Acid, Venous 3.9 (HH) 0.5 - 1.9 mmol/L   Comment NOTIFIED PHYSICIAN   I-stat chem 8, ED (not at [facility], [facility] or [facility])     Status: Abnormal   Collection Time: 09/08/22  7:57 AM  Result Value Ref Range   Sodium 133 (L) 135 - 145 mmol/L   Potassium 3.7 3.5 - 5.1 mmol/L   Chloride 104 98 - 111 mmol/L   BUN 11 8 - 23 mg/dL   Creatinine, Ser 2.95 0.44 - 1.00 mg/dL   Glucose, Bld 621 (H) 70 - 99 mg/dL   Calcium, Ion 3.08 (LL) 1.15 - 1.40 mmol/L   TCO2 22 22 - 32 mmol/L   Hemoglobin 14.3 12.0 - 15.0 g/dL   HCT 65.7 84.6 - 96.2 %   Comment NOTIFIED PHYSICIAN   I-Stat venous blood gas, ([facility] ED, [facility], [facility])     Status: Abnormal   Collection Time: 09/08/22  7:57 AM  Result Value Ref Range   pH, Ven 7.603 (HH) 7.25 - 7.43   pCO2, Ven 21.2 (L) 44 - 60 mmHg   pO2, Ven 140 (H) 32 - 45 mmHg   Bicarbonate 20.9 20.0 - 28.0 mmol/L   TCO2 22 22 - 32 mmol/L   O2 Saturation 100 %   Acid-Base Excess 1.0 0.0 - 2.0 mmol/L   Sodium 133 (L) 135 - 145 mmol/L   Potassium 3.8 3.5 - 5.1 mmol/L   Calcium, Ion 0.87 (LL) 1.15 - 1.40 mmol/L   HCT 41.0 36.0 - 46.0 %   Hemoglobin 13.9 12.0 - 15.0 g/dL   Sample type VENOUS    Comment NOTIFIED PHYSICIAN   CBC with Differential     Status: Abnormal   Collection Time: 09/08/22  8:16 AM  Result Value Ref Range   WBC 11.1 (H) 4.0 - 10.5 K/uL   RBC 4.23 3.87 - 5.11 MIL/uL   Hemoglobin 14.4 12.0 - 15.0 g/dL   HCT 95.2 84.1 - 32.4 %   MCV 95.7 80.0 - 100.0 fL   MCH 34.0 26.0 - 34.0 pg   MCHC 35.6 30.0 - 36.0 g/dL   RDW 40.1 02.7 - 25.3 %   Platelets 214 150 - 400 K/uL   nRBC 0.0 0.0 - 0.2 %   Neutrophils Relative % 91 %   Neutro Abs 10.1 (H) 1.7 - 7.7 K/uL   Lymphocytes Relative 4 %   Lymphs Abs 0.4 (L) 0.7 - 4.0 K/uL   Monocytes Relative 5 %   Monocytes Absolute 0.5 0.1 - 1.0 K/uL   Eosinophils Relative 0 %   Eosinophils Absolute 0.0 0.0 - 0.5 K/uL   Basophils Relative 0 %    Basophils Absolute 0.0 0.0 - 0.1 K/uL   Immature Granulocytes 0 %  Abs Immature Granulocytes 0.04 0.00 - 0.07 K/uL  Comprehensive metabolic panel     Status: Abnormal   Collection Time: 09/08/22  8:16 AM  Result Value Ref Range   Sodium 134 (L) 135 - 145 mmol/L   Potassium 3.5 3.5 - 5.1 mmol/L   Chloride 98 98 - 111 mmol/L   CO2 22 22 - 32 mmol/L   Glucose, Bld 429 (H) 70 - 99 mg/dL   BUN 10 8 - 23 mg/dL   Creatinine, Ser 1.61 0.44 - 1.00 mg/dL   Calcium 8.8 (L) 8.9 - 10.3 mg/dL   Total Protein 6.2 (L) 6.5 - 8.1 g/dL   Albumin 3.3 (L) 3.5 - 5.0 g/dL   AST 096 (H) 15 - 41 U/L   ALT 185 (H) 0 - 44 U/L   Alkaline Phosphatase 125 38 - 126 U/L   Total Bilirubin 2.4 (H) 0.3 - 1.2 mg/dL   GFR, Estimated >04 >54 mL/min   Anion gap 14 5 - 15  Lipase, blood     Status: Abnormal   Collection Time: 09/08/22  8:16 AM  Result Value Ref Range   Lipase 57 (H) 11 - 51 U/L  Troponin I (High Sensitivity)     Status: None   Collection Time: 09/08/22  8:16 AM  Result Value Ref Range   Troponin I (High Sensitivity) 12 <18 ng/L  Beta-hydroxybutyric acid     Status: Abnormal   Collection Time: 09/08/22  8:16 AM  Result Value Ref Range   Beta-Hydroxybutyric Acid 0.43 (H) 0.05 - 0.27 mmol/L  Urinalysis, Routine w reflex microscopic -Urine, Clean Catch     Status: Abnormal   Collection Time: 09/08/22  9:38 AM  Result Value Ref Range   Color, Urine YELLOW YELLOW   APPearance CLEAR CLEAR   Specific Gravity, Urine >1.046 (H) 1.005 - 1.030   pH 7.0 5.0 - 8.0   Glucose, UA >=500 (A) NEGATIVE mg/dL   Hgb urine dipstick NEGATIVE NEGATIVE   Bilirubin Urine NEGATIVE NEGATIVE   Ketones, ur 20 (A) NEGATIVE mg/dL   Protein, ur NEGATIVE NEGATIVE mg/dL   Nitrite NEGATIVE NEGATIVE   Leukocytes,Ua NEGATIVE NEGATIVE   RBC / HPF 0-5 0 - 5 RBC/hpf   WBC, UA 0-5 0 - 5 WBC/hpf   Bacteria, UA NONE SEEN NONE SEEN   Squamous Epithelial / HPF 0-5 0 - 5 /HPF  Troponin I (High Sensitivity)     Status: None    Collection Time: 09/08/22  9:52 AM  Result Value Ref Range   Troponin I (High Sensitivity) 13 <18 ng/L  I-Stat CG4 Lactic Acid     Status: Abnormal   Collection Time: 09/08/22 10:23 AM  Result Value Ref Range   Lactic Acid, Venous 2.6 (HH) 0.5 - 1.9 mmol/L   Comment NOTIFIED PHYSICIAN      Imaging Orders         CT Angio Chest PE W and/or Wo Contrast         CT ABDOMEN PELVIS W CONTRAST         US Abdomen Limited RUQ (LIVER/GB)         NM Hepato W/EF      Assessment and Plan   SYMONNE CARREON is an 72 y.o. female with abdominal pain.  Her workup is negative for signs of cholecystitis other than a positive sonographic Murphy sign.  Certainly her presentation is worrisome for cholecystitis as the cause of her symptoms.  I recommend admission and workup by the medical  service to rule out other causes.  I recommend checking a HIDA scan to evaluate for cholecystitis as the cause of her presentation.  If she does have acute cholecystitis I would recommend laparoscopic cholecystectomy.  She said she is not interested in having surgery but would like to complete the HIDA scan to better understand what is causing her pain.  We will have to have another discussion once the HIDA scan is back as to what she wants to do next.    Quentin Ore, MD  [facility] Use [email or web address] to contact on call provider

## 2022-09-08 NOTE — Progress Notes (Deleted)
Date: 09/08/2022               [name] MRN: [id number]  DOB: 25-Jan-1950 Age / Sex: 72 y.o., female   PCP: Shirline Frees, NP         Medical Service: Internal Medicine Teaching Service         Attending Physician: Dr. Mayford Knife, Dorene Ar, MD      First Contact: Tomie China, MD PGY-1 [phone]    Second Contact: Dr. Modena Slater, DO Pager [phone]         After Hours (After 5p/  First Contact Pager: [phone]  weekends / holidays): Second Contact Pager: [phone]   SUBJECTIVE   Chief Complaint: Epigastric pain  History of Present Illness:  Robin Tucker is a 72 year old white female with a past medical history significant for diagnosed, untreated type 2 diabetes (A1c of 11.5 in 2021) who presents to the [facility] ED with a 12-hour history of acute epigastric pain, nausea, and vomiting.    On interview, patient is lying sideways in an ED bed in some distress but attentive to interview.  Patient's presentation began at 4 AM this morning when the pain "woke her up."  She describes the pain as an "ache" that does not radiate to the back. Notably, patient had a less intense presentation of similar symptoms a few days ago which resolved on their own.  Also notes having a similar episode [facility] in ~2005, but was not given a definitive diagnosis at that time.  She has not seen a medical doctor since 2021.  Notes previous diagnosis of hiatal hernia and GERD, however she believes that this is a different process.  Patient endorses having chills the night before, as well as an episode of full body "shaking" and decorticate posturing which alarmed her.  She was conscious throughout the entire episode.  She describes taking her shirt into her mouth in order to avoid her teeth clattering together.  Is drinking and eating well.  Denies difficulty stooling.  Denies changes in stool color, consistency.  Patient says she urinates "every time she gets up" while awake  and every 3 hours at night.  This has happened for some years now.  Is vaguely aware that she had high sugars, but did not believe this be a problem.  (Per chart review, patient had an A1c of 11.5 in 2021.  She was prescribed metformin 500 mg twice daily but never took this medication.)  Endorses headache that resolved with Tylenol.  Denies fevers, chest pain, jaundice, shortness of breath.  Denies history of otitis, cancer, heart disease, and stroke.  Endorses that her abdomen has been progressively bloating in recent months, for reasons unknown.  No previous abdominal surgery, underwent 4 C-sections.  Patient does not take any medications.  Patient tries various supplements, most recently CoQ10 when previous episode of epigastric pain occurred.  Endorsed drinking "1 beer" every 6 or so weeks.  Denies other substance use. Per chart review, patient denied blood transfusion/product if deemed necessary for care.  When asked about CODE STATUS, patient asked if she could decide later.  Informed patient that she would remain on full code until she decided otherwise and that she could change this at any time.  At end of interview, patient noted nausea and agreed to take some Zofran.  ED Course:  Patient arrived at [facility] emergency department at 7:17 AM.  Vital signs on admit: BP 125/64, HR 115, respiratory rate  20, satting 96% on room air, afebrile.  General surgery and gastroenterology consulted.  Lipase slightly elevated at 57.  White blood cell count slightly elevated to 11.1.  Blood glucose of 416.  Lactic acid was 3.9, reduced to 2.6 after IV normal saline 1 L x2.  UA was significant for glucose greater than 500, positive for ketones.  Beta hydroxybutyrate: 0.43 high.  Liver function tests very high: AST to 396, and ALT to 185.  Increased bilirubin to 2.4.  Medications given:  fentanyl 50 mcg x2 IV normal saline 1L x2 IV ceftriaxone 2g x1 IV metoprolol 500 mg x1 Aspirin 81mg  x1 Tylenol 650mg   x1.  Meds:  Current Meds  Medication Sig   ASPIRIN 81 PO Take 324 mg by mouth once as needed (chest pain).   diazepam (VALIUM) 5 MG tablet Take 1 tablet (5 mg total) by mouth every 12 (twelve) hours as needed.   ibuprofen (ADVIL) 200 MG tablet Take 200 mg by mouth daily as needed for headache or moderate pain.    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 Substances: Endorses minimal alcohol use, denies other drug use including IV drugs.  Family History:   Mother: Heart attack, stroke, "intestinal cancer."   Uncle: Type 2 diabetes. Father: Undifferentiated cancer.  Allergies: Allergies as of 09/08/2022 - Review Complete 09/08/2022  Allergen Reaction Noted   Codeine Hives 06/26/2006    Review of Systems: A complete ROS was negative except as per HPI.   OBJECTIVE:   Physical Exam: Blood pressure 129/64, pulse (!) 108, temperature 98.8 F (37.1 C), resp. rate 14, height 5\' 4"  (1.626 m), weight 207 lb (93.9 kg), SpO2 93%.  Constitutional: Ill-appearing, laying on hospital bed, in some distress. HENT: normocephalic atraumatic, mucous membranes moist Eyes: conjunctiva non-erythematous, nonicteric Neck: supple Cardiovascular: Tachycardic, regular rhythm, no m/r/g Pulmonary/Chest: normal work of breathing on room air, lungs clear to auscultation bilaterally Abdominal: Distended abdomen, hyperactive bowel sounds.  Selectively tender in epigastric area, without rebound or guarding.  Fluid wave negative.  Positive for red dots across abdomen and lower legs consistent with an angiomas. MSK: normal bulk and tone Neurological: alert & oriented x 3, 5/5 strength  in bilateral upper and lower extremities, gait not assessed Skin: warm and dry Psych: Appropriate mood and affect.  Labs:  Troponins: 12 - >13  within normal limits Lipase: 57 high Blood glucose: 416 high Lactate: 3.9 high - > 2.6 high White blood cell: 11.1 high AST: 396 high ALT: 185 high Bilirubin: 2.4 high Beta hydroxybutyrate: 0.43 high UA: Significant for glucose: >500, ketones: 20.  CBC    Component Value Date/Time   WBC 11.1 (H) 09/08/2022 0816   RBC 4.23 09/08/2022 0816   HGB 14.4 09/08/2022 0816   HCT 40.5 09/08/2022 0816   PLT 214 09/08/2022 0816   MCV 95.7 09/08/2022 0816   MCV 98.5 (A) 01/02/2014 1154   MCH 34.0 09/08/2022 0816   MCHC 35.6 09/08/2022 0816   RDW 12.2 09/08/2022 0816   LYMPHSABS 0.4 (L) 09/08/2022 0816   MONOABS 0.5 09/08/2022 0816   EOSABS 0.0 09/08/2022 0816   BASOSABS  0.0 09/08/2022 0816     CMP     Component Value Date/Time   NA 134 (L) 09/08/2022 0816   K 3.5 09/08/2022 0816   CL 98 09/08/2022 0816   CO2 22 09/08/2022 0816   GLUCOSE 429 (H) 09/08/2022 0816   BUN 10 09/08/2022 0816   CREATININE 0.96 09/08/2022 0816   CREATININE 0.94 01/02/2014 1146   CALCIUM 8.8 (L) 09/08/2022 0816   PROT 6.2 (L) 09/08/2022 0816   ALBUMIN 3.3 (L) 09/08/2022 0816   AST 396 (H) 09/08/2022 0816   ALT 185 (H) 09/08/2022 0816   ALKPHOS 125 09/08/2022 0816   BILITOT 2.4 (H) 09/08/2022 0816   GFRNONAA >60 09/08/2022 0816   GFRNONAA 66 01/02/2014 1146   GFRAA 82 (L) 01/04/2014 0440   GFRAA 76 01/02/2014 1146    Imaging:  EKG: Sinus tach with biatrial enlargement. QTc: 465.  No significant change from previous  CT PE: Negative.  Significant for hepatic steatosis.  CT abdomen and pelvis with contrast: No acute findings.  Significant for hepatic steatosis.  Ultrasound abdomen right upper quadrant: No gallstones or gallbladder sludge.  Equivocal gallbladder wall thickening. Positive sonographic Murphy's sign reported. Imaging findings are  nonspecific. If there is a high clinical concern for acute cholecystitis consider further investigation with nuclear medicine hepatic biliary scan.  ASSESSMENT & PLAN:   Assessment & Plan by Problem: Principal Problem:   Epigastric pain Active Problems:   Morbid obesity (HCC)   Type 2 diabetes mellitus (HCC)   Robin Tucker is a 72 y.o. person living with a history of type 2 diabetes, untreated who presented with epigastric pain, nausea and vomiting and admitted for abdominal workup in setting of elevated LFTs on hospital day 0.  #Epigastric pain, undetermined etiology #Hepatic steatosis #Elevated LFTs #Nausea #Vomiting  This fast-onset nonradiating, aching epigastric pain in the setting of a glucose of 400, positive urine ketones, elevated LFTs/bilirubin suggests a broad differential. This includes: cholecystitis, pancreatitis, choledocholithiasis, large bowel obstruction versus perforation, NAFLD complicated by cirrhotic picture, or a focal liver process.  Abdominal imaging thus far nonspecific, but negative for focal liver lesions.  No obvious obstructive etiology at this time. Workup by general surgery suspicious for cholecystitis, and suggested HIDA scan, now scheduled for tomorrow.  Patient remains afebrile, white blood cell count slightly elevated to 11 -- lowering concern for acute cholecystitis or infectious etiology.  Given metronidazole/ceftriaxone x1 in the ED, which we will continue per GI recs. Her lipase is 57, virtually ruling out pancreatic etiology.  Patient pain, while present, has been adequately controlled with Tylenol and fentanyl.  Patient has refused Dilaudid and protonix in the ED.  While patient shows elevated bilirubin, no clinical signs of jaundice present.  Patient is firmly against further surgical treatment but is invested in determining what the etiology of the pain.  We will attempt to normalize patient status while on the floor.  - We will continue empiric  metronidazole/ceftriaxone tomorrow 8/18. - Continue as needed medications for pain, including: IV Dilaudid 0.5 mg every 6 hours as needed for severe pain. PO oxycodone 5 mg every 4 hours as needed for moderate pain. Per GI note, patient is to discontinue pain management medications for 6 hours before HIDA scan scheduled tomorrow 8/18. - Continue IV fluid repletion as needed - Await results of HIDA scan, scheduled for tomorrow. - Gastroenterology following, recs as below: Clear liquids okay, start NPO midnight Stop pain medications for 6 hours prior to HIDA scan Repeat CBC with  differential, lactate, CMP and a.m. Continue Rocephin and metronidazole coverage - General surgery following, recs as below:  Proceed with HIDA scan tomorrow 8/18.  Will follow up with patient to discuss future planning afterwards. - Morning labs to narrow differential tomorrow, including: Fractionated bilirubin, CBC with differential/platelet, CMP, A1c, hepatitis panel, HIV, lipid panel, triglycerides.  #T2DM #DKA versus HHS #Elevated lactase Glucose on admission was 416.  Positive urine glucose and ketones. Patient mentation status is at baseline.  No acute concern for decompensation.  Concern for early DKA, not active DKA yet. Bicarb is fine. Ketone presence r/o HHS. Borderline anion gap. Will start sensitive moderate scale insulin as well as mealtime and nightly correction.  Notes having frequent urination "for years."  Last A1c 11.5 from 2021. Denied taking prescribed metformin at home.  - Began moderate sliding scale insulin, as described in order set. - Began insulin aspart nightly correction. - Began mealtime insulin aspart 3 units. - Await new A1c.  Will discuss with patient options for future control over course of stay.  #Concern for Suicidal ideation - Per GI note, patient under severe stress. Sister says patient often talks about suicide, unaware of plan.  - Psych consult placed  Diet: Clear liquids,  NPO at midnight VTE: DOAC, it appears the patient has been refusing Lovenox IVF: NS Code: Full  Prior to Admission Living Arrangement: Home, living with renters Anticipated Discharge Location: Home Barriers to Discharge: Abdominal workup  Dispo: Admit patient to Inpatient with expected length of stay greater than 2 midnights.  Signed: Tomie China, MD Internal Medicine Resident PGY-1  09/08/2022, 4:00 PM

## 2022-09-08 NOTE — Plan of Care (Signed)
?  Problem: Nutrition: ?Goal: Adequate nutrition will be maintained ?Outcome: Not Progressing ?  ?Problem: Pain Managment: ?Goal: General experience of comfort will improve ?Outcome: Progressing ?  ?Problem: Safety: ?Goal: Ability to remain free from injury will improve ?Outcome: Progressing ?  ?

## 2022-09-08 NOTE — ED Provider Notes (Signed)
Robin Tucker Provider Note   CSN: [id number] Arrival date & time: 09/08/22  2841    History  Chief Complaint  Patient presents with   Chest Pain    Robin Tucker is a 72 y.o. female for evaluation of feeling unwell.  Approximately 4 AM this morning she began to have severe epigastric pain.  Does not radiate.  Associated NBNB emesis.  Had an episode similar a few days ago however not as severe and self resolved.  She became short of breath.  No numbness or weakness.  No back pain.  She is typically able to complete her ADLs without any chest pain or shortness of breath.  Does have history of GERD, hiatal hernia however does not feel similar.  She denies any chronic NSAID use, EtOH use.  Denies any bloody stool, no melena.  Unsure when last bowel movement was however stated that it was "normal."  No pain or swelling to lower legs, no history of PE or DVT.  No recent surgery, immobilization or malignancy.  No history of AAA, dissection.  Has felt generally unwell over the last 24 hours.  No sick contacts  HPI     Home Medications Prior to Admission medications   Medication Sig Start Date End Date Taking? Authorizing Provider  ASPIRIN 81 PO Take 324 mg by mouth once as needed (chest pain).   Yes [provider]  diazepam (VALIUM) 5 MG tablet Take 1 tablet (5 mg total) by mouth every 12 (twelve) hours as needed. 04/14/19  Yes Nafziger, Kandee Keen, NP  ibuprofen (ADVIL) 200 MG tablet Take 200 mg by mouth daily as needed for headache or moderate pain.   Yes [provider]      Allergies    Codeine    Review of Systems   Review of Systems  Constitutional:  Positive for activity change, appetite change and fatigue.  HENT: Negative.    Respiratory:  Positive for shortness of breath. Negative for apnea, cough, chest tightness, wheezing and stridor.   Cardiovascular:  Positive for chest pain. Negative for palpitations and leg  swelling.  Gastrointestinal:  Positive for abdominal pain, nausea and vomiting. Negative for abdominal distention, anal bleeding, constipation and diarrhea.  Genitourinary: Negative.   Musculoskeletal: Negative.   Skin: Negative.   Neurological: Negative.   All other systems reviewed and are negative.   Physical Exam Updated Vital Signs BP 129/64   Pulse (!) 108   Temp 98.8 F (37.1 C)   Resp 14   Ht 5\' 4"  (1.626 m)   Wt 93.9 kg   SpO2 93%   BMI 35.53 kg/m  Physical Exam Vitals and nursing note reviewed.  Constitutional:      General: She is not in acute distress.    Appearance: She is well-developed. She is ill-appearing. She is not toxic-appearing.  HENT:     Head: Atraumatic.  Eyes:     Pupils: Pupils are equal, round, and reactive to light.  Cardiovascular:     Rate and Rhythm: Tachycardia present.     Pulses:          Radial pulses are 2+ on the right side and 2+ on the left side.       Dorsalis pedis pulses are 2+ on the right side and 2+ on the left side.     Heart sounds: Normal heart sounds.  Pulmonary:     Effort: Pulmonary effort is normal. No respiratory distress.  Breath sounds: Normal breath sounds.     Comments: Clear bilaterally, speaks in full sentences. Chest:     Comments: nontender Abdominal:     General: Bowel sounds are normal. There is no distension or abdominal bruit.     Palpations: Abdomen is soft. There is no mass.     Tenderness: There is abdominal tenderness. There is guarding. There is no rebound. Positive signs include Murphy's sign. Negative signs include McBurney's sign and obturator sign.       Comments: Tenderness to epigastric region, diffuse voluntary guarding, positive murphy sign  Musculoskeletal:        General: Normal range of motion.     Cervical back: Normal range of motion.     Right lower leg: No tenderness. No edema.     Left lower leg: No tenderness. No edema.     Comments: Moves all 4 extremities at difficulty.   Compartments soft.  No lower extremity edema, calves nontender bilaterally  Skin:    General: Skin is warm and dry.  Neurological:     General: No focal deficit present.     Mental Status: She is alert.  Psychiatric:        Mood and Affect: Mood normal.    ED Results / Procedures / Treatments   Labs (all labs ordered are listed, but only abnormal results are displayed) Labs Reviewed  CBC WITH DIFFERENTIAL/PLATELET - Abnormal; Notable for the following components:      Result Value   WBC 11.1 (*)    Neutro Abs 10.1 (*)    Lymphs Abs 0.4 (*)    All other components within normal limits  COMPREHENSIVE METABOLIC PANEL - Abnormal; Notable for the following components:   Sodium 134 (*)    Glucose, Bld 429 (*)    Calcium 8.8 (*)    Total Protein 6.2 (*)    Albumin 3.3 (*)    AST 396 (*)    ALT 185 (*)    Total Bilirubin 2.4 (*)    All other components within normal limits  LIPASE, BLOOD - Abnormal; Notable for the following components:   Lipase 57 (*)    All other components within normal limits  URINALYSIS, ROUTINE W REFLEX MICROSCOPIC - Abnormal; Notable for the following components:   Specific Gravity, Urine >1.046 (*)    Glucose, UA >=500 (*)    Ketones, ur 20 (*)    All other components within normal limits  BETA-HYDROXYBUTYRIC ACID - Abnormal; Notable for the following components:   Beta-Hydroxybutyric Acid 0.43 (*)    All other components within normal limits  I-STAT CG4 LACTIC ACID, ED - Abnormal; Notable for the following components:   Lactic Acid, Venous 3.9 (*)    All other components within normal limits  I-STAT CHEM 8, ED - Abnormal; Notable for the following components:   Sodium 133 (*)    Glucose, Bld 443 (*)    Calcium, Ion 0.88 (*)    All other components within normal limits  CBG MONITORING, ED - Abnormal; Notable for the following components:   Glucose-Capillary 416 (*)    All other components within normal limits  I-STAT VENOUS BLOOD GAS, ED -  Abnormal; Notable for the following components:   pH, Ven 7.603 (*)    pCO2, Ven 21.2 (*)    pO2, Ven 140 (*)    Sodium 133 (*)    Calcium, Ion 0.87 (*)    All other components within normal limits  I-STAT CG4 LACTIC ACID,  ED - Abnormal; Notable for the following components:   Lactic Acid, Venous 2.6 (*)    All other components within normal limits  CULTURE, BLOOD (ROUTINE X 2)  CULTURE, BLOOD (ROUTINE X 2)  SARS CORONAVIRUS 2 BY RT PCR  TROPONIN I (HIGH SENSITIVITY)  TROPONIN I (HIGH SENSITIVITY)    EKG EKG Interpretation Date/Time:  Saturday September 08 2022 07:29:56 EDT Ventricular Rate:  113 PR Interval:  164 QRS Duration:  86 QT Interval:  339 QTC Calculation: 465 R Axis:   75  Text Interpretation: Sinus tachycardia Biatrial enlargement Low voltage, precordial leads Minimal ST depression, inferior leads no sig change from previous Confirmed by Arby Barrette [phone]) on 09/08/2022 8:02:29 AM  Radiology US Abdomen Limited RUQ (LIVER/GB)  Result Date: 09/08/2022 CLINICAL DATA:  Elevated LFTs and epigastric pain. EXAM: ULTRASOUND ABDOMEN LIMITED RIGHT UPPER QUADRANT COMPARISON:  CT from earlier today FINDINGS: Gallbladder: No gallstones, gallbladder sludge, or pericholecystic fluid. Equivocal wall thickening measuring between 2 and 3 mm. Positive sonographic Murphy's sign reported. Common bile duct: Diameter: 3.3 mm Liver: Did no focal lesion identified. Increased parenchymal echogenicity of the liver suggesting hepatic steatosis. Portal vein is patent on color Doppler imaging with normal direction of blood flow towards the liver. Other: None. IMPRESSION: 1. No gallstones or gallbladder sludge. Equivocal gallbladder wall thickening. Positive sonographic Murphy's sign reported. Imaging findings are nonspecific. If there is a high clinical concern for acute cholecystitis consider further investigation with nuclear medicine hepatic biliary scan. 2. Hepatic steatosis. Electronically Signed    By: Signa Kell M.D.   On: 09/08/2022 10:31   CT Angio Chest PE W and/or Wo Contrast  Result Date: 09/08/2022 CLINICAL DATA:  Epigastric pain, concern for pulmonary embolism. EXAM: CT ANGIOGRAPHY CHEST CT ABDOMEN AND PELVIS WITH CONTRAST TECHNIQUE: Multidetector CT imaging of the chest was performed using the standard protocol during bolus administration of intravenous contrast. Multiplanar CT image reconstructions and MIPs were obtained to evaluate the vascular anatomy. Multidetector CT imaging of the abdomen and pelvis was performed using the standard protocol during bolus administration of intravenous contrast. RADIATION DOSE REDUCTION: This exam was performed according to the departmental dose-optimization program which includes automated exposure control, adjustment of the mA and/or kV according to patient size and/or use of iterative reconstruction technique. CONTRAST:  75mL OMNIPAQUE IOHEXOL 350 MG/ML SOLN COMPARISON:  None Available. FINDINGS: CTA CHEST FINDINGS Cardiovascular: Satisfactory opacification of the pulmonary arteries to the segmental level. No evidence of pulmonary embolism. The heart is mildly enlarged. No pericardial effusion. Mediastinum/Nodes: No enlarged mediastinal, hilar, or axillary lymph nodes. Thyroid gland, trachea, and esophagus demonstrate no significant findings. Lungs/Pleura: There is mild-to-moderate bilateral atelectasis. No pleural effusion or pneumothorax. Musculoskeletal: Degenerative changes are seen in the spine. Review of the MIP images confirms the above findings. CT ABDOMEN and PELVIS FINDINGS Hepatobiliary: The liver is diffusely hypoattenuating, consistent with hepatic steatosis. No focal liver abnormality is seen. No gallstones, gallbladder wall thickening, or biliary dilatation. Pancreas: Unremarkable. No pancreatic ductal dilatation or surrounding inflammatory changes. Spleen: Normal in size without focal abnormality. Adrenals/Urinary Tract: Adrenal glands  are unremarkable. Kidneys are normal, without renal calculi, focal lesion, or hydronephrosis. Bladder is unremarkable. Stomach/Bowel: Stomach is within normal limits. Appendix appears normal. No evidence of bowel wall thickening, distention, or inflammatory changes. Vascular/Lymphatic: Aortic atherosclerosis. No enlarged abdominal or pelvic lymph nodes. Reproductive: A uterine mass measuring 3.8 cm likely represents a fibroid. No adnexal mass. Other: No abdominal wall hernia or abnormality. No abdominopelvic ascites. Musculoskeletal: Degenerative changes  are seen in the spine. Review of the MIP images confirms the above findings. IMPRESSION: 1. No evidence of pulmonary embolism. 2. No acute findings in the chest, abdomen or pelvis. 3. Hepatic steatosis. 4. Uterine mass likely represents a fibroid. Aortic Atherosclerosis (ICD10-I70.0). Electronically Signed   By: Romona Curls M.D.   On: 09/08/2022 09:27   CT ABDOMEN PELVIS W CONTRAST  Result Date: 09/08/2022 CLINICAL DATA:  Epigastric pain, concern for pulmonary embolism. EXAM: CT ANGIOGRAPHY CHEST CT ABDOMEN AND PELVIS WITH CONTRAST TECHNIQUE: Multidetector CT imaging of the chest was performed using the standard protocol during bolus administration of intravenous contrast. Multiplanar CT image reconstructions and MIPs were obtained to evaluate the vascular anatomy. Multidetector CT imaging of the abdomen and pelvis was performed using the standard protocol during bolus administration of intravenous contrast. RADIATION DOSE REDUCTION: This exam was performed according to the departmental dose-optimization program which includes automated exposure control, adjustment of the mA and/or kV according to patient size and/or use of iterative reconstruction technique. CONTRAST:  75mL OMNIPAQUE IOHEXOL 350 MG/ML SOLN COMPARISON:  None Available. FINDINGS: CTA CHEST FINDINGS Cardiovascular: Satisfactory opacification of the pulmonary arteries to the segmental level. No  evidence of pulmonary embolism. The heart is mildly enlarged. No pericardial effusion. Mediastinum/Nodes: No enlarged mediastinal, hilar, or axillary lymph nodes. Thyroid gland, trachea, and esophagus demonstrate no significant findings. Lungs/Pleura: There is mild-to-moderate bilateral atelectasis. No pleural effusion or pneumothorax. Musculoskeletal: Degenerative changes are seen in the spine. Review of the MIP images confirms the above findings. CT ABDOMEN and PELVIS FINDINGS Hepatobiliary: The liver is diffusely hypoattenuating, consistent with hepatic steatosis. No focal liver abnormality is seen. No gallstones, gallbladder wall thickening, or biliary dilatation. Pancreas: Unremarkable. No pancreatic ductal dilatation or surrounding inflammatory changes. Spleen: Normal in size without focal abnormality. Adrenals/Urinary Tract: Adrenal glands are unremarkable. Kidneys are normal, without renal calculi, focal lesion, or hydronephrosis. Bladder is unremarkable. Stomach/Bowel: Stomach is within normal limits. Appendix appears normal. No evidence of bowel wall thickening, distention, or inflammatory changes. Vascular/Lymphatic: Aortic atherosclerosis. No enlarged abdominal or pelvic lymph nodes. Reproductive: A uterine mass measuring 3.8 cm likely represents a fibroid. No adnexal mass. Other: No abdominal wall hernia or abnormality. No abdominopelvic ascites. Musculoskeletal: Degenerative changes are seen in the spine. Review of the MIP images confirms the above findings. IMPRESSION: 1. No evidence of pulmonary embolism. 2. No acute findings in the chest, abdomen or pelvis. 3. Hepatic steatosis. 4. Uterine mass likely represents a fibroid. Aortic Atherosclerosis (ICD10-I70.0). Electronically Signed   By: Romona Curls M.D.   On: 09/08/2022 09:27    Procedures .Critical Care  Performed by: Linwood Dibbles, PA-C Authorized by: Linwood Dibbles, PA-C   Critical care provider statement:    Critical care  time (minutes):  35   Critical care was necessary to treat or prevent imminent or life-threatening deterioration of the following conditions:  Sepsis, endocrine crisis and dehydration   Critical care was time spent personally by me on the following activities:  Development of treatment plan with patient or surrogate, discussions with consultants, evaluation of patient's response to treatment, examination of patient, ordering and review of laboratory studies, ordering and review of radiographic studies, ordering and performing treatments and interventions, pulse oximetry, re-evaluation of patient's condition and review of old charts     Medications Ordered in ED Medications  ondansetron (ZOFRAN) injection 4 mg (4 mg Intravenous Patient Refused/Not Given 09/08/22 0752)  pantoprazole (PROTONIX) injection 40 mg (40 mg Intravenous Patient Refused/Not Given 09/08/22 0752)  HYDROmorphone (DILAUDID) injection 0.5 mg (0.5 mg Intravenous Patient Refused/Not Given 09/08/22 1358)  sodium chloride 0.9 % bolus 1,000 mL (0 mLs Intravenous Stopped 09/08/22 1015)  fentaNYL (SUBLIMAZE) injection 50 mcg (50 mcg Intravenous Given 09/08/22 0808)  sodium chloride 0.9 % bolus 1,000 mL (0 mLs Intravenous Stopped 09/08/22 1129)  iohexol (OMNIPAQUE) 350 MG/ML injection 75 mL (75 mLs Intravenous Contrast Given 09/08/22 0839)  cefTRIAXone (ROCEPHIN) 2 g in sodium chloride 0.9 % 100 mL IVPB (0 g Intravenous Stopped 09/08/22 1022)  metroNIDAZOLE (FLAGYL) IVPB 500 mg (500 mg Intravenous New Bag/Given 09/08/22 1132)  fentaNYL (SUBLIMAZE) injection 50 mcg (50 mcg Intravenous Given 09/08/22 1140)  ondansetron (ZOFRAN) injection 4 mg (4 mg Intravenous Given 09/08/22 1140)  acetaminophen (TYLENOL) tablet 650 mg (650 mg Oral Given 09/08/22 1225)   ED Course/ Medical Decision Making/ A&P Clinical Course as of 09/08/22 1406  Sat Sep 08, 2022  1230 Dr. Dossie Der with surgery, rec medicine admit, poss Gi consult, will see in consult [BH]   1301 Amy Easterwood with GI, Will see in consult [BH]  1321 IM teaching to see for admission [BH]    Clinical Course User Index [BH] Carlinda Ohlson A, PA-C    72 year old here for evaluation of feeling unwell.  Epigastric abdominal pain, shortness of breath, nausea, vomiting.  No back pain.  Had similar episode few days ago however not as severe.  Having normal bowel movements.  Denies any bloody stool, melena.  No chronic NSAID use, EtOH use.  Has not seen a medical provider in approximately 7 years per patient.  Patient tachycardic in room.  Bedside blood sugar greater than 400, patient denies history of diabetes however per chart review seems she had an A1c greater than 11 -3 years ago was lost to follow-up.  Question sepsis versus PE versus ACS versus intra-abdominal process versus DKA versus HHS.  Patient refused Pepcid, COVID test.  Labs and imaging personally viewed and interpreted:  CBG 416, no history of diabetes add on beta hydroxy, VBG Lactic acid 3.9 VBG pH 7.6, normal bicarb, low calcium Metabolic panel glucose 429 elevated LFTs, normal alk phos Lipase 57 Trop 12   Patient being difficult with nursing staff.  I went to reassess patient.  She is agitated that we cannot tell her "what is wrong."  I discussed the labs that had resulted including elevated LFTs, elevated lactic acid, tachycardia.  I placed orders for Rocephin given SIRS criteria.  She is pending her CT scan official read by radiologist however on my review I do not see any significant abnormality.  Her location of pain and labs I am concerned about her gallbladder.  Again I recommended antibiotics to patient however she declines.  We discussed risk versus benefit however patient seems frustrated again that we cannot tell her what is wrong so far and is declining antibiotics at this time.  Patient reassessed.  Pain is improved however still has some pain.  We discussed her labs and imaging.  I suspect she likely has  cholecystitis/ cholangitis and this is the reason for her pain, emesis, elevated labs and vital signs.  Her ultrasound is equivocal which they recommend HIDA scan.  I discussed this with patient.  Again she seems very frustrated that I cannot give her exact answers as to when her symptoms will improve or a definitive answer for her pain.  We discussed pain management, antibiotics as well as specialist consult and medicine admission.  She is not sure she would want to  have surgery if needed.  Will discuss with team further workup and evaluation.  Consult made to general surgery, GI, will see in consult Consult to IM teaching service to evaluate patient for admission  Will be admitted for SIRS criteria with antibiotics to cover for intra-abdominal source, suspect cholangitis, pain control and antiemetics  Seen evaluated Dr. Clarice Pole who agrees with above treatment, plan disposition  The patient appears reasonably stabilized for admission considering the current resources, flow, and capabilities available in the ED at this time, and I doubt any other [facility] requiring further screening and/or treatment in the ED prior to admission.                                  Medical Decision Making Amount and/or Complexity of Data Reviewed Independent Historian: EMS External Data Reviewed: labs, radiology, ECG and notes. Labs: ordered. Decision-making details documented in ED Course. Radiology: ordered and independent interpretation performed. Decision-making details documented in ED Course. ECG/medicine tests: ordered and independent interpretation performed. Decision-making details documented in ED Course.  Risk OTC drugs. Prescription drug management. Parenteral controlled substances. Decision regarding hospitalization. Diagnosis or treatment significantly limited by social determinants of health.          Final Clinical Impression(s) / ED Diagnoses Final diagnoses:  Epigastric pain  Elevated  LFTs  Hyperglycemia  SIRS (systemic inflammatory response syndrome) (HCC)    Rx / DC Orders ED Discharge Orders     None         Nihal Marzella A, PA-C 09/08/22 1406    Arby Barrette, MD 09/12/22 [phone]

## 2022-09-08 NOTE — Hospital Course (Addendum)
She is tender to the RUQ and [place] is showing a murphys sign.   LFTs are up.  BS in the 400s.   PCP did say that her A1c is greater than 11  Lactiv 2.9.  White count 11  Gs is gong to come see  GI is going to come see.   Pt presented due to epigastric pain. Denies any previous diagnosis of diabetes. Says she eats better than most americans,said she had a big salad yesterday. She drinks a beer every 6 weeks. Uses CBT sometimes for recreational purposes. She recently took COQ10 when her chest pain started a week ago. Denies any bloody stools. Says she pees a lot because she drinks a lot. She pees every 3 hours for years, Denies any heart pathologies in the past despite numerous tests.   She denies any tobacco use. Says she got so cold and was shivering last night and couldn't control her movement but she was conscious  the entire time. Denies biting her tongue. Had massive vertigo in 2016. Denies liver issues In the past. No kidney stones.   Is totally dependent on self, does all her ADLS herself. Denies any trauma. Had 4 C-sessions in the past.    Mother had Heart Attack. No CVS history in family. Uncle and 2 cousins had diabetes. Denies hx of hepatitis. No history of fatty liver. Denied use of IV drugs. Endorses recent bloated abdomen.    Exam: Obese appearing woman laying in bed, answering appropriately to questions, didn't seem to be in active pain at this time, denied her PRN dilaudid at bedside. Distended abdomen with red spots ( says they are not new, they have been there since she was about 10 years )  Not ready for code status decision at this time.

## 2022-09-09 ENCOUNTER — Inpatient Hospital Stay (HOSPITAL_COMMUNITY): Payer: Medicare Other

## 2022-09-09 DIAGNOSIS — K828 Other specified diseases of gallbladder: Secondary | ICD-10-CM | POA: Diagnosis not present

## 2022-09-09 DIAGNOSIS — R1013 Epigastric pain: Secondary | ICD-10-CM | POA: Diagnosis not present

## 2022-09-09 DIAGNOSIS — K760 Fatty (change of) liver, not elsewhere classified: Secondary | ICD-10-CM

## 2022-09-09 DIAGNOSIS — R748 Abnormal levels of other serum enzymes: Secondary | ICD-10-CM | POA: Diagnosis not present

## 2022-09-09 DIAGNOSIS — R945 Abnormal results of liver function studies: Secondary | ICD-10-CM | POA: Diagnosis not present

## 2022-09-09 LAB — CBC WITH DIFFERENTIAL/PLATELET
Abs Immature Granulocytes: 0.03 10*3/uL (ref 0.00–0.07)
Basophils Absolute: 0 10*3/uL (ref 0.0–0.1)
Basophils Relative: 0 %
Eosinophils Absolute: 0 10*3/uL (ref 0.0–0.5)
Eosinophils Relative: 0 %
HCT: 38 % (ref 36.0–46.0)
Hemoglobin: 12.8 g/dL (ref 12.0–15.0)
Immature Granulocytes: 0 %
Lymphocytes Relative: 9 %
Lymphs Abs: 0.9 10*3/uL (ref 0.7–4.0)
MCH: 33.2 pg (ref 26.0–34.0)
MCHC: 33.7 g/dL (ref 30.0–36.0)
MCV: 98.4 fL (ref 80.0–100.0)
Monocytes Absolute: 0.6 10*3/uL (ref 0.1–1.0)
Monocytes Relative: 7 %
Neutro Abs: 7.9 10*3/uL — ABNORMAL HIGH (ref 1.7–7.7)
Neutrophils Relative %: 84 %
Platelets: 224 10*3/uL (ref 150–400)
RBC: 3.86 MIL/uL — ABNORMAL LOW (ref 3.87–5.11)
RDW: 12.8 % (ref 11.5–15.5)
WBC: 9.5 10*3/uL (ref 4.0–10.5)
nRBC: 0 % (ref 0.0–0.2)

## 2022-09-09 LAB — COMPREHENSIVE METABOLIC PANEL
ALT: 248 U/L — ABNORMAL HIGH (ref 0–44)
AST: 208 U/L — ABNORMAL HIGH (ref 15–41)
Albumin: 2.9 g/dL — ABNORMAL LOW (ref 3.5–5.0)
Alkaline Phosphatase: 99 U/L (ref 38–126)
Anion gap: 13 (ref 5–15)
BUN: 9 mg/dL (ref 8–23)
CO2: 23 mmol/L (ref 22–32)
Calcium: 8.6 mg/dL — ABNORMAL LOW (ref 8.9–10.3)
Chloride: 104 mmol/L (ref 98–111)
Creatinine, Ser: 0.79 mg/dL (ref 0.44–1.00)
GFR, Estimated: >60 mL/min (ref >60)
Glucose, Bld: 262 mg/dL — ABNORMAL HIGH (ref 70–99)
Potassium: 3.6 mmol/L (ref 3.5–5.1)
Sodium: 140 mmol/L (ref 135–145)
Total Bilirubin: 3.9 mg/dL — ABNORMAL HIGH (ref 0.3–1.2)
Total Protein: 5.8 g/dL — ABNORMAL LOW (ref 6.5–8.1)

## 2022-09-09 LAB — TRIGLYCERIDES: Triglycerides: 77 mg/dL (ref <150)

## 2022-09-09 LAB — GLUCOSE, CAPILLARY
Glucose-Capillary: 235 mg/dL — ABNORMAL HIGH (ref 70–99)
Glucose-Capillary: 211 mg/dL — ABNORMAL HIGH (ref 70–99)
Glucose-Capillary: 213 mg/dL — ABNORMAL HIGH (ref 70–99)
Glucose-Capillary: 206 mg/dL — ABNORMAL HIGH (ref 70–99)
Glucose-Capillary: 179 mg/dL — ABNORMAL HIGH (ref 70–99)

## 2022-09-09 LAB — LIPID PANEL
Cholesterol: 158 mg/dL (ref 0–200)
HDL: 43 mg/dL (ref >40)
LDL Cholesterol: 99 mg/dL (ref 0–99)
Total CHOL/HDL Ratio: 3.7 ratio
Triglycerides: 80 mg/dL (ref <150)
VLDL: 16 mg/dL (ref 0–40)

## 2022-09-09 LAB — HEPATITIS PANEL, ACUTE
HCV Ab: NONREACTIVE
Hep A IgM: NONREACTIVE
Hep B C IgM: NONREACTIVE
Hepatitis B Surface Ag: NONREACTIVE

## 2022-09-09 LAB — HEMOGLOBIN A1C
Hgb A1c MFr Bld: 11.7 % — ABNORMAL HIGH (ref 4.8–5.6)
Mean Plasma Glucose: 289.09 mg/dL

## 2022-09-09 LAB — HIV ANTIBODY (ROUTINE TESTING W REFLEX): HIV Screen 4th Generation wRfx: NONREACTIVE

## 2022-09-09 LAB — BILIRUBIN, FRACTIONATED(TOT/DIR/INDIR)
Bilirubin, Direct: 2.1 mg/dL — ABNORMAL HIGH (ref 0.0–0.2)
Indirect Bilirubin: 1.7 mg/dL — ABNORMAL HIGH (ref 0.3–0.9)
Total Bilirubin: 3.8 mg/dL — ABNORMAL HIGH (ref 0.3–1.2)

## 2022-09-09 MED ORDER — TECHNETIUM TC 99M MEBROFENIN IV KIT
7.6000 | PACK | Freq: Once | INTRAVENOUS | Status: AC | PRN
  Administered 2022-09-09: 7.6 via INTRAVENOUS

## 2022-09-09 MED ORDER — SODIUM CHLORIDE 0.9 % IV SOLN
2.0000 g | INTRAVENOUS | Status: DC
  Administered 2022-09-09 – 2022-09-11 (×3): 2 g via INTRAVENOUS
  Filled 2022-09-09 (×3): qty 20

## 2022-09-09 MED ORDER — METRONIDAZOLE 500 MG/100ML IV SOLN
500.0000 mg | Freq: Two times a day (BID) | INTRAVENOUS | Status: DC
  Administered 2022-09-09 – 2022-09-11 (×5): 500 mg via INTRAVENOUS
  Filled 2022-09-09 (×5): qty 100

## 2022-09-09 MED ORDER — INSULIN GLARGINE-YFGN 100 UNIT/ML ~~LOC~~ SOLN
10.0000 [IU] | Freq: Every day | SUBCUTANEOUS | Status: DC
  Administered 2022-09-09 – 2022-09-10 (×2): 10 [IU] via SUBCUTANEOUS
  Filled 2022-09-09 (×3): qty 0.1

## 2022-09-09 MED ORDER — SENNOSIDES-DOCUSATE SODIUM 8.6-50 MG PO TABS: 1.0000 | ORAL_TABLET | Freq: Every evening | ORAL | Status: DC | PRN

## 2022-09-09 MED ORDER — INSULIN GLARGINE-YFGN 100 UNIT/ML ~~LOC~~ SOLN: 10.0000 [IU] | Freq: Every day | SUBCUTANEOUS | Status: DC

## 2022-09-09 NOTE — Progress Notes (Signed)
RN called into pt room complaining severe abdominal pain and still nauseated. PRN medication were given. Pt also refused for COVID test. Will continue to monitor

## 2022-09-09 NOTE — Progress Notes (Signed)
Subjective: Reports ongoing nausea but no emesis this morning. Epigastric pain improved but still present. Bilirubin increased today (2.4 --> 3.9).   ROS: See above, otherwise other systems negative  Objective: Vital signs in last 24 hours: Temp:  [98.3 F (36.8 C)-99 F (37.2 C)] 98.7 F (37.1 C) (08/18 0759) Pulse Rate:  [81-109] 85 (08/18 0759) Resp:  [14-19] 19 (08/18 0759) BP: (103-141)/(62-89) 103/89 (08/18 0759) SpO2:  [92 %-97 %] 94 % (08/18 0759)    Intake/Output from previous day: 08/17 0701 - 08/18 0700 In: 2409.7 [P.O.:240; IV Piggyback:2169.7] Out: -  Intake/Output this shift: No intake/output data recorded.  PE: Gen: female resting in bed, mild distress Resp: equal chest rise, no increased WOB CV: RRR Abd: soft, moderately distended, TTP in the epigastric region  Lab Results:  Recent Labs    09/08/22 0816 09/09/22 0315  WBC 11.1* 9.5  HGB 14.4 12.8  HCT 40.5 38.0  PLT 214 224   BMET Recent Labs    09/08/22 1745 09/09/22 0315  NA 139 140  K 3.9 3.6  CL 104 104  CO2 21* 23  GLUCOSE 357* 262*  BUN 8 9  CREATININE 0.78 0.79  CALCIUM 8.6* 8.6*   PT/INR No results for input(s): "LABPROT", "INR" in the last 72 hours. CMP     Component Value Date/Time   NA 140 09/09/2022 0315   K 3.6 09/09/2022 0315   CL 104 09/09/2022 0315   CO2 23 09/09/2022 0315   GLUCOSE 262 (H) 09/09/2022 0315   BUN 9 09/09/2022 0315   CREATININE 0.79 09/09/2022 0315   CREATININE 0.94 01/02/2014 1146   CALCIUM 8.6 (L) 09/09/2022 0315   PROT 5.8 (L) 09/09/2022 0315   ALBUMIN 2.9 (L) 09/09/2022 0315   AST 208 (H) 09/09/2022 0315   ALT 248 (H) 09/09/2022 0315   ALKPHOS 99 09/09/2022 0315   BILITOT 3.9 (H) 09/09/2022 0315   BILITOT 3.8 (H) 09/09/2022 0315   GFRNONAA >60 09/09/2022 0315   GFRNONAA 66 01/02/2014 1146   GFRAA 82 (L) 01/04/2014 0440   GFRAA 76 01/02/2014 1146   Lipase     Component Value Date/Time   LIPASE 57 (H) 09/08/2022 0816     Studies/Results: US Abdomen Limited RUQ (LIVER/GB)  Result Date: 09/08/2022 CLINICAL DATA:  Elevated LFTs and epigastric pain. EXAM: ULTRASOUND ABDOMEN LIMITED RIGHT UPPER QUADRANT COMPARISON:  CT from earlier today FINDINGS: Gallbladder: No gallstones, gallbladder sludge, or pericholecystic fluid. Equivocal wall thickening measuring between 2 and 3 mm. Positive sonographic Murphy's sign reported. Common bile duct: Diameter: 3.3 mm Liver: Did no focal lesion identified. Increased parenchymal echogenicity of the liver suggesting hepatic steatosis. Portal vein is patent on color Doppler imaging with normal direction of blood flow towards the liver. Other: None. IMPRESSION: 1. No gallstones or gallbladder sludge. Equivocal gallbladder wall thickening. Positive sonographic Murphy's sign reported. Imaging findings are nonspecific. If there is a high clinical concern for acute cholecystitis consider further investigation with nuclear medicine hepatic biliary scan. 2. Hepatic steatosis. Electronically Signed   By: Signa Kell M.D.   On: 09/08/2022 10:31   CT Angio Chest PE W and/or Wo Contrast  Result Date: 09/08/2022 CLINICAL DATA:  Epigastric pain, concern for pulmonary embolism. EXAM: CT ANGIOGRAPHY CHEST CT ABDOMEN AND PELVIS WITH CONTRAST TECHNIQUE: Multidetector CT imaging of the chest was performed using the standard protocol during bolus administration of intravenous contrast. Multiplanar CT image reconstructions and MIPs were obtained to evaluate the vascular anatomy. Multidetector  CT imaging of the abdomen and pelvis was performed using the standard protocol during bolus administration of intravenous contrast. RADIATION DOSE REDUCTION: This exam was performed according to the departmental dose-optimization program which includes automated exposure control, adjustment of the mA and/or kV according to patient size and/or use of iterative reconstruction technique. CONTRAST:  75mL OMNIPAQUE IOHEXOL  350 MG/ML SOLN COMPARISON:  None Available. FINDINGS: CTA CHEST FINDINGS Cardiovascular: Satisfactory opacification of the pulmonary arteries to the segmental level. No evidence of pulmonary embolism. The heart is mildly enlarged. No pericardial effusion. Mediastinum/Nodes: No enlarged mediastinal, hilar, or axillary lymph nodes. Thyroid gland, trachea, and esophagus demonstrate no significant findings. Lungs/Pleura: There is mild-to-moderate bilateral atelectasis. No pleural effusion or pneumothorax. Musculoskeletal: Degenerative changes are seen in the spine. Review of the MIP images confirms the above findings. CT ABDOMEN and PELVIS FINDINGS Hepatobiliary: The liver is diffusely hypoattenuating, consistent with hepatic steatosis. No focal liver abnormality is seen. No gallstones, gallbladder wall thickening, or biliary dilatation. Pancreas: Unremarkable. No pancreatic ductal dilatation or surrounding inflammatory changes. Spleen: Normal in size without focal abnormality. Adrenals/Urinary Tract: Adrenal glands are unremarkable. Kidneys are normal, without renal calculi, focal lesion, or hydronephrosis. Bladder is unremarkable. Stomach/Bowel: Stomach is within normal limits. Appendix appears normal. No evidence of bowel wall thickening, distention, or inflammatory changes. Vascular/Lymphatic: Aortic atherosclerosis. No enlarged abdominal or pelvic lymph nodes. Reproductive: A uterine mass measuring 3.8 cm likely represents a fibroid. No adnexal mass. Other: No abdominal wall hernia or abnormality. No abdominopelvic ascites. Musculoskeletal: Degenerative changes are seen in the spine. Review of the MIP images confirms the above findings. IMPRESSION: 1. No evidence of pulmonary embolism. 2. No acute findings in the chest, abdomen or pelvis. 3. Hepatic steatosis. 4. Uterine mass likely represents a fibroid. Aortic Atherosclerosis (ICD10-I70.0). Electronically Signed   By: Romona Curls M.D.   On: 09/08/2022 09:27    CT ABDOMEN PELVIS W CONTRAST  Result Date: 09/08/2022 CLINICAL DATA:  Epigastric pain, concern for pulmonary embolism. EXAM: CT ANGIOGRAPHY CHEST CT ABDOMEN AND PELVIS WITH CONTRAST TECHNIQUE: Multidetector CT imaging of the chest was performed using the standard protocol during bolus administration of intravenous contrast. Multiplanar CT image reconstructions and MIPs were obtained to evaluate the vascular anatomy. Multidetector CT imaging of the abdomen and pelvis was performed using the standard protocol during bolus administration of intravenous contrast. RADIATION DOSE REDUCTION: This exam was performed according to the departmental dose-optimization program which includes automated exposure control, adjustment of the mA and/or kV according to patient size and/or use of iterative reconstruction technique. CONTRAST:  75mL OMNIPAQUE IOHEXOL 350 MG/ML SOLN COMPARISON:  None Available. FINDINGS: CTA CHEST FINDINGS Cardiovascular: Satisfactory opacification of the pulmonary arteries to the segmental level. No evidence of pulmonary embolism. The heart is mildly enlarged. No pericardial effusion. Mediastinum/Nodes: No enlarged mediastinal, hilar, or axillary lymph nodes. Thyroid gland, trachea, and esophagus demonstrate no significant findings. Lungs/Pleura: There is mild-to-moderate bilateral atelectasis. No pleural effusion or pneumothorax. Musculoskeletal: Degenerative changes are seen in the spine. Review of the MIP images confirms the above findings. CT ABDOMEN and PELVIS FINDINGS Hepatobiliary: The liver is diffusely hypoattenuating, consistent with hepatic steatosis. No focal liver abnormality is seen. No gallstones, gallbladder wall thickening, or biliary dilatation. Pancreas: Unremarkable. No pancreatic ductal dilatation or surrounding inflammatory changes. Spleen: Normal in size without focal abnormality. Adrenals/Urinary Tract: Adrenal glands are unremarkable. Kidneys are normal, without renal  calculi, focal lesion, or hydronephrosis. Bladder is unremarkable. Stomach/Bowel: Stomach is within normal limits. Appendix appears normal. No evidence of  bowel wall thickening, distention, or inflammatory changes. Vascular/Lymphatic: Aortic atherosclerosis. No enlarged abdominal or pelvic lymph nodes. Reproductive: A uterine mass measuring 3.8 cm likely represents a fibroid. No adnexal mass. Other: No abdominal wall hernia or abnormality. No abdominopelvic ascites. Musculoskeletal: Degenerative changes are seen in the spine. Review of the MIP images confirms the above findings. IMPRESSION: 1. No evidence of pulmonary embolism. 2. No acute findings in the chest, abdomen or pelvis. 3. Hepatic steatosis. 4. Uterine mass likely represents a fibroid. Aortic Atherosclerosis (ICD10-I70.0). Electronically Signed   By: Romona Curls M.D.   On: 09/08/2022 09:27    Anti-infectives: Anti-infectives (From admission, onward)    Start     Dose/Rate Route Frequency Ordered Stop   09/09/22 1000  metroNIDAZOLE (FLAGYL) IVPB 500 mg        500 mg 100 mL/hr over 60 Minutes Intravenous Every 12 hours 09/09/22 0448     09/09/22 0900  cefTRIAXone (ROCEPHIN) 2 g in sodium chloride 0.9 % 100 mL IVPB        2 g 200 mL/hr over 30 Minutes Intravenous Every 24 hours 09/09/22 0448     09/08/22 1115  metroNIDAZOLE (FLAGYL) IVPB 500 mg        500 mg 100 mL/hr over 60 Minutes Intravenous  Once 09/08/22 1108 09/08/22 1235   09/08/22 0915  cefTRIAXone (ROCEPHIN) 2 g in sodium chloride 0.9 % 100 mL IVPB        2 g 200 mL/hr over 30 Minutes Intravenous  Once 09/08/22 0909 09/08/22 1022       Assessment/Plan  72 y/o F presented with abdominal pain with CT/US showing GB wall thickening without obvious cholelithiasis  - Increase in dBili concerning for possible choledocholithiasis - Will follow up HIDA - GI following, appreciate their recommendations - Surgery will continue to follow  I reviewed last 24 h vitals and pain  scores, last 48 h intake and output, last 24 h labs and trends, and last 24 h imaging results.  This care required moderate level of medical decision making.    LOS: 1 day   [facility] 09/09/2022, 9:41 AM Please see Amion for pager number during day hours 7:00am-4:30pm or 7:00am -11:30am on weekends

## 2022-09-09 NOTE — Progress Notes (Signed)
HIDA negative for cholecystitis.  Surgery team will sign off.  Agree with GI recommendations.  Please call with questions.  Quentin Ore, MD

## 2022-09-09 NOTE — Evaluation (Signed)
Physical Therapy Evaluation Patient Details Name: Robin Tucker MRN: [id number] DOB: [birth year] Today's Date: 09/09/2022  History of Present Illness  Pt is a 72 y.o. female presenting 09/08/2022 with epigastric pain, nausea, an vomiting. Labs notable for  slight elevation white count at 11, lipase 57, glucose over 400, AST 396, ALT 185, alk phos 125, total bili 2.4, Lactate 2.6. Found to have questionable gallbladder thickening. HIDA scan 8/18 normal. PMH significant for untreated DMII.    Clinical Impression  Pt in bed upon arrival of PT, agreeable to evaluation at this time. Prior to admission the pt was independent with all mobility, living alone, and no assist needed for ADLs or IADLs. The pt presents today with deficits in power, strength, stability, and endurance, likely due to recent immobility due to pain and illness. The pt needed minA to steady with all OOB mobility, declined use of gait belt or RW but with significant sway with all movement. The pt was limited to short distance ambulation in the room due to fatigue and feeling "toxic", will need to demo improved stability and endurance with gait as well as capacity to navigate a flight of stairs (bedroom/bathroom upstairs at home) prior to being ready to d/c home alone.      If plan is discharge home, recommend the following: A little help with walking and/or transfers;A little help with bathing/dressing/bathroom;Help with stairs or ramp for entrance   Can travel by private vehicle        Equipment Recommendations Rolling walker (2 wheels)  Recommendations for Other Services       Functional Status Assessment Patient has had a recent decline in their functional status and demonstrates the ability to make significant improvements in function in a reasonable and predictable amount of time.     Precautions / Restrictions Precautions Precautions: Fall Restrictions Weight Bearing Restrictions: No      Mobility  Bed  Mobility Overal bed mobility: Needs Assistance Bed Mobility: Supine to Sit     Supine to sit: Supervision     General bed mobility comments: increased time    Transfers Overall transfer level: Needs assistance Equipment used: 1 person hand held assist Transfers: Sit to/from Stand Sit to Stand: Min assist           General transfer comment: minA to steady    Ambulation/Gait Ambulation/Gait assistance: Min assist Gait Distance (Feet): 15 Feet (+15 ft) Assistive device: 1 person hand held assist Gait Pattern/deviations: Step-through pattern, Decreased stride length Gait velocity: decreased Gait velocity interpretation: <1.31 ft/sec, indicative of household ambulator   General Gait Details: pt with intermittent drifting and needing miNA to steady, holding furniture with RUE and PT in LUE    Balance Overall balance assessment: Needs assistance Sitting-balance support: No upper extremity supported Sitting balance-Leahy Scale: Good     Standing balance support: Single extremity supported Standing balance-Leahy Scale: Fair Standing balance comment: poor tolerance for mobility, minA through HHA to steady                             Pertinent Vitals/Pain Pain Assessment Pain Assessment: Faces Pain Score: 3  Faces Pain Scale: Hurts little more Pain Location: R shoulder Pain Descriptors / Indicators: Discomfort Pain Intervention(s): Limited activity within patient's tolerance, Monitored during session, Repositioned, Heat applied    Home Living Family/patient expects to be discharged to:: Private residence Living Arrangements: Alone Available Help at Discharge: Family;Available PRN/intermittently Type of Home: House Home  Access: Stairs to enter Entrance Stairs-Rails: None Entrance Stairs-Number of Steps: 4-5 Alternate Level Stairs-Number of Steps: 11 Home Layout: Bed/bath upstairs;Two level Home Equipment: None      Prior Function Prior Level of  Function : Independent/Modified Independent;Driving;History of Falls (last six months)             Mobility Comments: ambulates without DME, no structured exercise but can do yard work ADLs Comments: indpendent, not working     Extremity/Trunk Assessment   Upper Extremity Assessment Upper Extremity Assessment: Defer to OT evaluation    Lower Extremity Assessment Lower Extremity Assessment: Overall WFL for tasks assessed    Cervical / Trunk Assessment Cervical / Trunk Assessment: Normal  Communication   Communication Communication: No apparent difficulties Cueing Techniques: Verbal cues  Cognition Arousal: Alert Behavior During Therapy: WFL for tasks assessed/performed Overall Cognitive Status: Within Functional Limits for tasks assessed                                 General Comments: pt following commands well        General Comments General comments (skin integrity, edema, etc.): VSS oN RA    Exercises     Assessment/Plan    PT Assessment Patient needs continued PT services  PT Problem List Decreased activity tolerance;Decreased balance       PT Treatment Interventions DME instruction;Stair training;Gait training;Functional mobility training;Therapeutic activities;Therapeutic exercise;Balance training    PT Goals (Current goals can be found in the Care Plan section)  Acute Rehab PT Goals Patient Stated Goal: return to independence PT Goal Formulation: With patient Time For Goal Achievement: 09/23/22 Potential to Achieve Goals: Good    Frequency Min 1X/week        AM-PAC PT "6 Clicks" Mobility  Outcome Measure Help needed turning from your back to your side while in a flat bed without using bedrails?: None Help needed moving from lying on your back to sitting on the side of a flat bed without using bedrails?: None Help needed moving to and from a bed to a chair (including a wheelchair)?: A Little Help needed standing up from a chair  using your arms (e.g., wheelchair or bedside chair)?: A Little Help needed to walk in hospital room?: A Little Help needed climbing 3-5 steps with a railing? : A Little 6 Click Score: 20    End of Session Equipment Utilized During Treatment:  (pt refused gait belt) Activity Tolerance: Patient limited by fatigue Patient left: in chair;with call bell/phone within reach;with chair alarm set;with family/visitor present Nurse Communication: Mobility status PT Visit Diagnosis: Other abnormalities of gait and mobility (R26.89);Unsteadiness on feet (R26.81)    Time: 4098-1191 PT Time Calculation (min) (ACUTE ONLY): 31 min   Charges:   PT Evaluation $PT Eval Low Complexity: 1 Low   PT General Charges $$ ACUTE PT VISIT: 1 Visit         Vickki Muff, PT, DPT   Acute Rehabilitation Department Office [phone] Secure Chat Communication Preferred  Robin Tucker 09/09/2022, 3:58 PM

## 2022-09-09 NOTE — Progress Notes (Signed)
Patient ID: Robin Tucker, female   DOB: 15-Jun-1950, 72 y.o.   MRN: [id number]    Progress Note   Subjective  Day # 2 CC; acute severe epigastric pain  IV Rocephin/IV metronidazole  HIDA scan this morning read as normal  WBC 9.5/hemoglobin 12.8 Acute hepatitis panel negative T. bili 3.9/alk phos 99/AST 208/ALT 248  Patient says she feels much better today minimal pain, no nausea or vomiting   Objective   Vital signs in last 24 hours: Temp:  [98.3 F (36.8 C)-99 F (37.2 C)] 98.7 F (37.1 C) (08/18 0759) Pulse Rate:  [81-91] 85 (08/18 0759) Resp:  [17-19] 19 (08/18 0759) BP: (103-141)/(63-89) 103/89 (08/18 0759) SpO2:  [92 %-97 %] 94 % (08/18 0759)   General: Older white female in NAD-early jaundice Heart:  Regular rate and rhythm; no murmurs Lungs: Respirations even and unlabored, lungs CTA bilaterally Abdomen:  Soft, minimally tender in the epigastrium no guarding or rebound nondistended. Normal bowel sounds. Extremities:  Without edema. Neurologic:  Alert and oriented,  grossly normal neurologically. Psych:  Cooperative. Normal mood and affect.  Intake/Output from previous day: 08/17 0701 - 08/18 0700 In: 2409.7 [P.O.:240; IV Piggyback:2169.7] Out: -  Intake/Output this shift: No intake/output data recorded.  Lab Results: Recent Labs    09/08/22 0757 09/08/22 0816 09/09/22 0315  WBC  --  11.1* 9.5  HGB 13.9  14.3 14.4 12.8  HCT 41.0  42.0 40.5 38.0  PLT  --  214 224   BMET Recent Labs    09/08/22 0816 09/08/22 1745 09/09/22 0315  NA 134* 139 140  K 3.5 3.9 3.6  CL 98 104 104  CO2 22 21* 23  GLUCOSE 429* 357* 262*  BUN 10 8 9   CREATININE 0.96 0.78 0.79  CALCIUM 8.8* 8.6* 8.6*   LFT Recent Labs    09/09/22 0315  PROT 5.8*  ALBUMIN 2.9*  AST 208*  ALT 248*  ALKPHOS 99  BILITOT 3.8*  3.9*  BILIDIR 2.1*  IBILI 1.7*   PT/INR No results for input(s): "LABPROT", "INR" in the last 72 hours.  Studies/Results: NM Hepatobiliary Liver  Func  Result Date: 09/09/2022 CLINICAL DATA:  Evaluate for cholecystitis. EXAM: NUCLEAR MEDICINE HEPATOBILIARY IMAGING TECHNIQUE: Sequential images of the abdomen were obtained out to 60 minutes following intravenous administration of radiopharmaceutical. RADIOPHARMACEUTICALS:  7.6 mCi Tc-47m  Choletec IV COMPARISON:  Right upper quadrant sonogram from 09/08/2022 FINDINGS: Prompt uptake and biliary excretion of activity by the liver is seen. Near the end of the first hour of imaging the gallbladder activity is visualized, consistent with patency of cystic duct. Subsequently, biliary activity passes into small bowel, consistent with patent common bile duct. IMPRESSION: 1. Patent cystic duct without evidence for acute cholecystitis. 2. No signs of biliary obstruction. Electronically Signed   By: Signa Kell M.D.   On: 09/09/2022 11:50   US Abdomen Limited RUQ (LIVER/GB)  Result Date: 09/08/2022 CLINICAL DATA:  Elevated LFTs and epigastric pain. EXAM: ULTRASOUND ABDOMEN LIMITED RIGHT UPPER QUADRANT COMPARISON:  CT from earlier today FINDINGS: Gallbladder: No gallstones, gallbladder sludge, or pericholecystic fluid. Equivocal wall thickening measuring between 2 and 3 mm. Positive sonographic Murphy's sign reported. Common bile duct: Diameter: 3.3 mm Liver: Did no focal lesion identified. Increased parenchymal echogenicity of the liver suggesting hepatic steatosis. Portal vein is patent on color Doppler imaging with normal direction of blood flow towards the liver. Other: None. IMPRESSION: 1. No gallstones or gallbladder sludge. Equivocal gallbladder wall thickening. Positive sonographic Murphy's sign  reported. Imaging findings are nonspecific. If there is a high clinical concern for acute cholecystitis consider further investigation with nuclear medicine hepatic biliary scan. 2. Hepatic steatosis. Electronically Signed   By: Signa Kell M.D.   On: 09/08/2022 10:31   CT Angio Chest PE W and/or Wo  Contrast  Result Date: 09/08/2022 CLINICAL DATA:  Epigastric pain, concern for pulmonary embolism. EXAM: CT ANGIOGRAPHY CHEST CT ABDOMEN AND PELVIS WITH CONTRAST TECHNIQUE: Multidetector CT imaging of the chest was performed using the standard protocol during bolus administration of intravenous contrast. Multiplanar CT image reconstructions and MIPs were obtained to evaluate the vascular anatomy. Multidetector CT imaging of the abdomen and pelvis was performed using the standard protocol during bolus administration of intravenous contrast. RADIATION DOSE REDUCTION: This exam was performed according to the departmental dose-optimization program which includes automated exposure control, adjustment of the mA and/or kV according to patient size and/or use of iterative reconstruction technique. CONTRAST:  75mL OMNIPAQUE IOHEXOL 350 MG/ML SOLN COMPARISON:  None Available. FINDINGS: CTA CHEST FINDINGS Cardiovascular: Satisfactory opacification of the pulmonary arteries to the segmental level. No evidence of pulmonary embolism. The heart is mildly enlarged. No pericardial effusion. Mediastinum/Nodes: No enlarged mediastinal, hilar, or axillary lymph nodes. Thyroid gland, trachea, and esophagus demonstrate no significant findings. Lungs/Pleura: There is mild-to-moderate bilateral atelectasis. No pleural effusion or pneumothorax. Musculoskeletal: Degenerative changes are seen in the spine. Review of the MIP images confirms the above findings. CT ABDOMEN and PELVIS FINDINGS Hepatobiliary: The liver is diffusely hypoattenuating, consistent with hepatic steatosis. No focal liver abnormality is seen. No gallstones, gallbladder wall thickening, or biliary dilatation. Pancreas: Unremarkable. No pancreatic ductal dilatation or surrounding inflammatory changes. Spleen: Normal in size without focal abnormality. Adrenals/Urinary Tract: Adrenal glands are unremarkable. Kidneys are normal, without renal calculi, focal lesion, or  hydronephrosis. Bladder is unremarkable. Stomach/Bowel: Stomach is within normal limits. Appendix appears normal. No evidence of bowel wall thickening, distention, or inflammatory changes. Vascular/Lymphatic: Aortic atherosclerosis. No enlarged abdominal or pelvic lymph nodes. Reproductive: A uterine mass measuring 3.8 cm likely represents a fibroid. No adnexal mass. Other: No abdominal wall hernia or abnormality. No abdominopelvic ascites. Musculoskeletal: Degenerative changes are seen in the spine. Review of the MIP images confirms the above findings. IMPRESSION: 1. No evidence of pulmonary embolism. 2. No acute findings in the chest, abdomen or pelvis. 3. Hepatic steatosis. 4. Uterine mass likely represents a fibroid. Aortic Atherosclerosis (ICD10-I70.0). Electronically Signed   By: Romona Curls M.D.   On: 09/08/2022 09:27   CT ABDOMEN PELVIS W CONTRAST  Result Date: 09/08/2022 CLINICAL DATA:  Epigastric pain, concern for pulmonary embolism. EXAM: CT ANGIOGRAPHY CHEST CT ABDOMEN AND PELVIS WITH CONTRAST TECHNIQUE: Multidetector CT imaging of the chest was performed using the standard protocol during bolus administration of intravenous contrast. Multiplanar CT image reconstructions and MIPs were obtained to evaluate the vascular anatomy. Multidetector CT imaging of the abdomen and pelvis was performed using the standard protocol during bolus administration of intravenous contrast. RADIATION DOSE REDUCTION: This exam was performed according to the departmental dose-optimization program which includes automated exposure control, adjustment of the mA and/or kV according to patient size and/or use of iterative reconstruction technique. CONTRAST:  75mL OMNIPAQUE IOHEXOL 350 MG/ML SOLN COMPARISON:  None Available. FINDINGS: CTA CHEST FINDINGS Cardiovascular: Satisfactory opacification of the pulmonary arteries to the segmental level. No evidence of pulmonary embolism. The heart is mildly enlarged. No pericardial  effusion. Mediastinum/Nodes: No enlarged mediastinal, hilar, or axillary lymph nodes. Thyroid gland, trachea, and esophagus demonstrate no  significant findings. Lungs/Pleura: There is mild-to-moderate bilateral atelectasis. No pleural effusion or pneumothorax. Musculoskeletal: Degenerative changes are seen in the spine. Review of the MIP images confirms the above findings. CT ABDOMEN and PELVIS FINDINGS Hepatobiliary: The liver is diffusely hypoattenuating, consistent with hepatic steatosis. No focal liver abnormality is seen. No gallstones, gallbladder wall thickening, or biliary dilatation. Pancreas: Unremarkable. No pancreatic ductal dilatation or surrounding inflammatory changes. Spleen: Normal in size without focal abnormality. Adrenals/Urinary Tract: Adrenal glands are unremarkable. Kidneys are normal, without renal calculi, focal lesion, or hydronephrosis. Bladder is unremarkable. Stomach/Bowel: Stomach is within normal limits. Appendix appears normal. No evidence of bowel wall thickening, distention, or inflammatory changes. Vascular/Lymphatic: Aortic atherosclerosis. No enlarged abdominal or pelvic lymph nodes. Reproductive: A uterine mass measuring 3.8 cm likely represents a fibroid. No adnexal mass. Other: No abdominal wall hernia or abnormality. No abdominopelvic ascites. Musculoskeletal: Degenerative changes are seen in the spine. Review of the MIP images confirms the above findings. IMPRESSION: 1. No evidence of pulmonary embolism. 2. No acute findings in the chest, abdomen or pelvis. 3. Hepatic steatosis. 4. Uterine mass likely represents a fibroid. Aortic Atherosclerosis (ICD10-I70.0). Electronically Signed   By: Romona Curls M.D.   On: 09/08/2022 09:27       Assessment / Plan:    #22 72 year old white female with acute severe epigastric pain waking her from sleep about 4 AM yesterday associated with nausea and vomiting  Imaging with CT scan and ultrasound showed hepatic steatosis, no  gallstones noted on CT or ultrasound but ultrasound did raise question of equivocal gallbladder wall thickening. CBD 3.3 mm Lactate elevated at 2.6 LFTs elevated on admission with T. bili of 2.4/alk phos 125/AST 396/ALT 185  HIDA scan today shows patent cystic duct  Patient is feeling much better today minimal epigastric pain, no nausea or vomiting  Suspect she did have early acalculous cholecystitis which is improving on IV antibiotics  #2 severe hyperglycemia-untreated adult onset diabetes mellitus #3 uterine fibroid #4.  Obesity #5.  History of hyperplastic colon polyp last colonoscopy 2021 #6 depression/with suicidal ideation per sister-psychiatry on board/  Plan; gradually advance diet as tolerates- low-fat for short-term Continue Rocephin and metronidazole Continue to trend LFTs She has continued improvement and LFTs are trending down she may be able to be transition to oral antibiotics with plan to complete a 7-day course GI will be available if needed  Principal Problem:   Epigastric pain Active Problems:   Morbid obesity (HCC)   Type 2 diabetes mellitus (HCC)     LOS: 1 day   Kylle Lall EsterwoodPA-C  09/09/2022, 1:46 PM

## 2022-09-09 NOTE — Consult Note (Signed)
Patient is not available for psychiatric evaluation. She is out for procedure per her daughter, Jacki Cones who is also waiting in her patient's hospital room. Plan-Will evaluate patient tomorrow-09/10/22  Thedore Mins, MD Attending psychiatrist

## 2022-09-09 NOTE — Evaluation (Addendum)
Occupational Therapy Evaluation Patient Details Name: Robin Tucker MRN: [id number] DOB: 10/03/1950 Today's Date: 09/09/2022   History of Present Illness Pt is a 72 y.o. female presenting 09/08/2022 with epigastric pain, nausea, an vomiting. Labs notable for  slight elevation white count at 11, lipase 57, glucose over 400, AST 396, ALT 185, alk phos 125, total bili 2.4, Lactate 2.6. Found to have questionable gallbladder thickening. HIDA scan 8/18 normal. PMH significant for untreated DMII   Clinical Impression   PTA, pt lived alone and was mod I for ADL and IADL. Upon eval, pt performing LB ADL with up to min A for balance. Passing short blessed test with score of 0. Pt with decreased strength, energy, and activity tolerance. Pt reports vertigo at baseline and use of compensatory techniques but very rapid movements such as coming to EOB during session. Will follow to optimize return to ADL, but do not anticipate need for follow up OT after discharge.        If plan is discharge home, recommend the following: A little help with walking and/or transfers;A little help with bathing/dressing/bathroom;Help with stairs or ramp for entrance;Assist for transportation;Assistance with cooking/housework    Functional Status Assessment  Patient has had a recent decline in their functional status and demonstrates the ability to make significant improvements in function in a reasonable and predictable amount of time.  Equipment Recommendations  None recommended by OT    Recommendations for Other Services       Precautions / Restrictions Precautions Precautions: Fall Restrictions Weight Bearing Restrictions: No      Mobility Bed Mobility Overal bed mobility: Needs Assistance Bed Mobility: Supine to Sit     Supine to sit: Supervision     General bed mobility comments: increased time    Transfers Overall transfer level: Needs assistance Equipment used: 1 person hand held  assist Transfers: Sit to/from Stand Sit to Stand: Min assist           General transfer comment: minA to steady      Balance Overall balance assessment: Needs assistance Sitting-balance support: No upper extremity supported Sitting balance-Leahy Scale: Good     Standing balance support: Single extremity supported Standing balance-Leahy Scale: Fair Standing balance comment: poor tolerance for mobility, minA through HHA to steady                           ADL either performed or assessed with clinical judgement   ADL Overall ADL's : Needs assistance/impaired Eating/Feeding: Set up;Sitting   Grooming: Contact guard assist;Standing;Oral care;Wash/dry hands   Upper Body Bathing: Set up;Sitting   Lower Body Bathing: Minimal assistance;Sit to/from stand   Upper Body Dressing : Set up;Sitting   Lower Body Dressing: Contact guard assist;Sit to/from stand   Toilet Transfer: Contact guard assist;Minimal assistance;Ambulation   Toileting- Clothing Manipulation and Hygiene: Supervision/safety;Sitting/lateral lean       Functional mobility during ADLs: Contact guard assist General ADL Comments: 1 person HHA     Vision Baseline Vision/History: 1 Wears glasses Ability to See in Adequate Light: 0 Adequate Patient Visual Report: No change from baseline Vision Assessment?: No apparent visual deficits Additional Comments: intermittent poor vision with onset of vertigo per pt not complaining of this during session     Perception Perception: Not tested       Praxis Praxis: Not tested       Pertinent Vitals/Pain Pain Assessment Pain Assessment: Faces Faces Pain Scale: Hurts little more  Pain Location: R shoulder Pain Descriptors / Indicators: Discomfort Pain Intervention(s): Limited activity within patient's tolerance, Monitored during session     Extremity/Trunk Assessment Upper Extremity Assessment Upper Extremity Assessment: Generalized weakness (R  shoulder pain today, reporting she thinks she slept on it wrong. strength and mobility equal to that of L)   Lower Extremity Assessment Lower Extremity Assessment: Defer to PT evaluation   Cervical / Trunk Assessment Cervical / Trunk Assessment: Normal   Communication Communication Communication: No apparent difficulties Cueing Techniques: Verbal cues   Cognition Arousal: Alert Behavior During Therapy: WFL for tasks assessed/performed Overall Cognitive Status: Within Functional Limits for tasks assessed                                 General Comments: pt following commands well     General Comments  VSS on RA    Exercises     Shoulder Instructions      Home Living Family/patient expects to be discharged to:: Private residence Living Arrangements: Alone Available Help at Discharge: Family;Available PRN/intermittently Type of Home: House Home Access: Stairs to enter [employer] of Steps: 4-5 Entrance Stairs-Rails: None Home Layout: Bed/bath upstairs;Two level Alternate Level Stairs-Number of Steps: 11 Alternate Level Stairs-Rails: Right Bathroom Shower/Tub: Producer, television/film/video: Standard Bathroom Accessibility: No   Home Equipment: None          Prior Functioning/Environment Prior Level of Function : Independent/Modified Independent;Driving;History of Falls (last six months)             Mobility Comments: ambulates without DME, no structured exercise but can do yard work ADLs Comments: indpendent, not working        OT Problem List: Decreased strength;Decreased activity tolerance;Impaired balance (sitting and/or standing);Decreased knowledge of use of DME or AE      OT Treatment/Interventions: Self-care/ADL training;Neuromuscular education;DME and/or AE instruction;Balance training;Patient/family education;Therapeutic activities    OT Goals(Current goals can be found in the care plan section) Acute Rehab OT  Goals Patient Stated Goal: go home OT Goal Formulation: With patient Time For Goal Achievement: 09/23/22 Potential to Achieve Goals: Good  OT Frequency: Min 1X/week    Co-evaluation              AM-PAC OT "6 Clicks" Daily Activity     Outcome Measure Help from another person eating meals?: None Help from another person taking care of personal grooming?: A Little Help from another person toileting, which includes using toliet, bedpan, or urinal?: A Little Help from another person bathing (including washing, rinsing, drying)?: A Little Help from another person to put on and taking off regular upper body clothing?: A Little Help from another person to put on and taking off regular lower body clothing?: A Little 6 Click Score: 19   End of Session Equipment Utilized During Treatment: Gait belt Nurse Communication: Mobility status  Activity Tolerance: Patient tolerated treatment well Patient left: in chair;with chair alarm set;with family/visitor present;with call bell/phone within reach  OT Visit Diagnosis: Unsteadiness on feet (R26.81);Muscle weakness (generalized) (M62.81);BPPV                Time: 5784-6962 OT Time Calculation (min): 32 min Charges:  OT General Charges $OT Visit: 1 Visit OT Evaluation $OT Eval Low Complexity: 1 Low  Tyler Deis, OTR/L [facility] Acute Rehabilitation Office: [phone]   Myrla Halsted 09/09/2022, 6:16 PM

## 2022-09-09 NOTE — H&P (Signed)
Date: 09/09/2022               [name] MRN: [id number]  DOB: 1950/03/23 Age / Sex: 72 y.o., female   PCP: Shirline Frees, NP         Medical Service: Internal Medicine Teaching Service         Attending Physician: Dr. Mayford Knife, Dorene Ar, MD      First Contact: Dr. Tomie China [phone]   Second Contact: Dr. Modena Slater, DO Pager [phone]         After Hours (After 5p/  First Contact Pager: [phone]  weekends / holidays): Second Contact Pager: [phone]   SUBJECTIVE   Chief Complaint: Epigastric Pain  History of Present Illness:   Robin Tucker is a 72 year old white female with a past medical history significant for diagnosed, untreated type 2 diabetes (A1c of 11.5 in 2021) who presents to the [facility] ED with a 12-hour history of acute epigastric pain, nausea, and vomiting.     On interview, patient is lying sideways in an ED bed in some distress but attentive to interview.  Patient's presentation began at 4 AM this morning when the pain "woke her up."  She describes the pain as an "ache" that does not radiate to the back. Notably, patient had a less intense presentation of similar symptoms a few days ago which resolved on their own.  Also notes having a similar episode [facility] in ~2005, but was not given a definitive diagnosis at that time.  She has not seen a medical doctor since 2021.  Notes previous diagnosis of hiatal hernia and GERD, however she believes that this is a different process.   Patient endorses having chills the night before, as well as an episode of full body "shaking" and decorticate posturing which alarmed her.  She was conscious throughout the entire episode.  She describes taking her shirt into her mouth in order to avoid her teeth clattering together.  Is drinking and eating well.  Denies difficulty stooling.  Denies changes in stool color, consistency.  Patient says she urinates "every time she gets up" while awake and every 3  hours at night.  This has happened for some years now.  Is vaguely aware that she had high sugars, but did not believe this be a problem.  (Per chart review, patient had an A1c of 11.5 in 2021.  She was prescribed metformin 500 mg twice daily but never took this medication.)  Endorses headache that resolved with Tylenol.  Denies fevers, chest pain, jaundice, shortness of breath.  Denies history of otitis, cancer, heart disease, and stroke.  Endorses that her abdomen has been progressively bloating in recent months, for reasons unknown.  No previous abdominal surgery, underwent 4 C-sections.   Patient does not take any medications.  Patient tries various supplements, most recently CoQ10 when previous episode of epigastric pain occurred.  Endorsed drinking "1 beer" every 6 or so weeks.  Denies other substance use. Per chart review, patient denied blood transfusion/product if deemed necessary for care.  When asked about CODE STATUS, patient asked if she could decide later.  Informed patient that she would remain on full code until she decided otherwise and that she could change this at any time.  At end of interview, patient noted nausea and agreed to take some Zofran.  ED Course: Patient arrived at [facility] emergency department at 7:17 AM.  Vital signs on admit: BP 125/64, HR 115, respiratory rate  20, satting 96% on room air, afebrile.  General surgery and gastroenterology consulted.  Lipase slightly elevated at 57.  White blood cell count slightly elevated to 11.1.  Blood glucose of 416.  Lactic acid was 3.9, reduced to 2.6 after IV normal saline 1 L x2.  UA was significant for glucose greater than 500, positive for ketones.  Beta hydroxybutyrate: 0.43 high.  Liver function tests very high: AST to 396, and ALT to 185.  Increased bilirubin to 2.4.   Past Medical History Past Medical History:  Diagnosis Date   Anxiety    Chicken pox 1954   Colon polyps    Depression    Endometriosis    Vertigo       Meds:  Current Meds  Medication Sig   ASPIRIN 81 PO Take 324 mg by mouth once as needed (chest pain).   diazepam (VALIUM) 5 MG tablet Take 1 tablet (5 mg total) by mouth every 12 (twelve) hours as needed.   ibuprofen (ADVIL) 200 MG tablet Take 200 mg by mouth daily as needed for headache or moderate pain.    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 Substances: Endorses minimal alcohol use, denies other drug use including IV drugs.  Family History:  Mother: Heart attack, stroke, "intestinal cancer."   Uncle: Type 2 diabetes. Father: Undifferentiated cancer.  Allergies: Allergies as of 09/08/2022 - Review Complete 09/08/2022  Allergen Reaction Noted   Codeine Hives 06/26/2006    Review of Systems: A complete ROS was negative except as per HPI.   OBJECTIVE:   Physical Exam: Blood pressure 132/63, pulse 91, temperature 99 F (37.2 C), temperature source Oral, resp. rate 17, height 5\' 4"  (1.626 m), weight 93.9 kg, SpO2 92%.  Constitutional: Ill-appearing, laying on hospital bed, in some distress. HENT: normocephalic atraumatic, mucous membranes moist Eyes: conjunctiva non-erythematous, nonicteric Neck: supple Cardiovascular: Tachycardic, regular rhythm, no m/r/g Pulmonary/Chest: normal work of breathing on room air, lungs clear to auscultation bilaterally Abdominal: Distended abdomen, hyperactive bowel sounds.  Selectively tender in epigastric area, without rebound or guarding.  Fluid wave negative.  Positive for red dots across abdomen and lower legs consistent with an angiomas. MSK: normal bulk and tone Neurological: alert & oriented x 3, 5/5 strength in bilateral upper  and lower extremities, gait not assessed Skin: warm and dry Psych: Appropriate mood and affect.  Labs: CBC    Component Value Date/Time   WBC 9.5 09/09/2022 0315   RBC 3.86 (L) 09/09/2022 0315   HGB 12.8 09/09/2022 0315   HCT 38.0 09/09/2022 0315   PLT 224 09/09/2022 0315   MCV 98.4 09/09/2022 0315   MCV 98.5 (A) 01/02/2014 1154   MCH 33.2 09/09/2022 0315   MCHC 33.7 09/09/2022 0315   RDW 12.8 09/09/2022 0315   LYMPHSABS 0.9 09/09/2022 0315   MONOABS 0.6 09/09/2022 0315   EOSABS 0.0 09/09/2022 0315   BASOSABS 0.0 09/09/2022 0315     CMP     Component Value Date/Time   NA 140 09/09/2022 0315   K 3.6 09/09/2022 0315   CL 104 09/09/2022 0315   CO2 23 09/09/2022 0315   GLUCOSE 262 (  H) 09/09/2022 0315   BUN 9 09/09/2022 0315   CREATININE 0.79 09/09/2022 0315   CREATININE 0.94 01/02/2014 1146   CALCIUM 8.6 (L) 09/09/2022 0315   PROT 5.8 (L) 09/09/2022 0315   ALBUMIN 2.9 (L) 09/09/2022 0315   AST 208 (H) 09/09/2022 0315   ALT 248 (H) 09/09/2022 0315   ALKPHOS 99 09/09/2022 0315   BILITOT 3.9 (H) 09/09/2022 0315   GFRNONAA >60 09/09/2022 0315   GFRNONAA 66 01/02/2014 1146   GFRAA 82 (L) 01/04/2014 0440   GFRAA 76 01/02/2014 1146    Imaging:  EKG: Sinus tach with biatrial enlargement. QTc: 465.  No significant change from previous   CT PE: Negative.  Significant for hepatic steatosis.   CT abdomen and pelvis with contrast: No acute findings.  Significant for hepatic steatosis.   Ultrasound abdomen right upper quadrant: No gallstones or gallbladder sludge.  Equivocal gallbladder wall thickening. Positive sonographic Murphy's sign reported. Imaging findings are nonspecific. If there is a high clinical concern for acute cholecystitis consider further investigation with nuclear medicine hepatic biliary scan.  ASSESSMENT & PLAN:   Assessment & Plan by Problem: Principal Problem:   Epigastric pain Active Problems:   Morbid obesity (HCC)   Type 2 diabetes mellitus  (HCC)   Robin Tucker is a 73 y.o. person living with a history of type 2 diabetes, untreated who presented with epigastric pain, nausea and vomiting and admitted for abdominal workup in setting of elevated LFTs on hospital day 0.   #Epigastric pain, undetermined etiology #Hepatic steatosis #Elevated LFTs #Nausea #Vomiting   This fast-onset nonradiating, aching epigastric pain in the setting of a glucose of 400, positive urine ketones, elevated LFTs/bilirubin suggests a broad differential. This includes: cholecystitis, pancreatitis, choledocholithiasis, large bowel obstruction versus perforation, NAFLD complicated by cirrhotic picture, or a focal liver process.  Abdominal imaging thus far nonspecific, but negative for focal liver lesions.  No obvious obstructive etiology at this time. Workup by general surgery suspicious for cholecystitis, and suggested HIDA scan, now scheduled for tomorrow.  Patient remains afebrile, white blood cell count slightly elevated to 11 -- lowering concern for acute cholecystitis or infectious etiology.  Given metronidazole/ceftriaxone x1 in the ED, which we will continue per GI recs. Her lipase is 57, virtually ruling out pancreatic etiology.  Patient pain, while present, has been adequately controlled with Tylenol and fentanyl.  Patient has refused Dilaudid and protonix in the ED.  While patient shows elevated bilirubin, no clinical signs of jaundice present.  Patient is firmly against further surgical treatment but is invested in determining what the etiology of the pain.  We will attempt to normalize patient status while on the floor.   - We will continue empiric metronidazole/ceftriaxone tomorrow 8/18. - Continue as needed medications for pain, including: IV Dilaudid 0.5 mg every 6 hours as needed for severe pain. PO oxycodone 5 mg every 4 hours as needed for moderate pain. Per GI note, patient is to discontinue pain management medications for 6 hours before HIDA  scan scheduled tomorrow 8/18. - Continue IV fluid repletion as needed - Await results of HIDA scan, scheduled for tomorrow. - Gastroenterology following, recs as below: Clear liquids okay, start NPO midnight Stop pain medications for 6 hours prior to HIDA scan Repeat CBC with differential, lactate, CMP and a.m. Continue Rocephin and metronidazole coverage - General surgery following, recs as below:             Proceed with HIDA scan tomorrow 8/18.  Will follow  up with patient to discuss future planning afterwards. - Morning labs to narrow differential tomorrow, including: Fractionated bilirubin, CBC with differential/platelet, CMP, A1c, hepatitis panel, HIV, lipid panel, triglycerides.   #T2DM #DKA versus HHS #Elevated lactase Glucose on admission was 416.  Positive urine glucose and ketones. Patient mentation status is at baseline.  No acute concern for decompensation.  Concern for early DKA, not active DKA yet. Bicarb is fine. Ketone presence r/o HHS. Borderline anion gap. Will start sensitive moderate scale insulin as well as mealtime and nightly correction.  Notes having frequent urination "for years."  Last A1c 11.5 from 2021. Denied taking prescribed metformin at home.   - Began moderate sliding scale insulin, as described in order set. - Began insulin aspart nightly correction. - Began mealtime insulin aspart 3 units. - Await new A1c.  Will discuss with patient options for future control over course of stay.   #Concern for Suicidal ideation - Per GI note, patient under severe stress. Sister says patient often talks about suicide, unaware of plan.  - Psych consult placed   Diet: Clear liquids, NPO at midnight VTE: DOAC, it appears the patient has been refusing Lovenox IVF: NS Code: Full   Prior to Admission Living Arrangement: Home, living with renters Anticipated Discharge Location: Home Barriers to Discharge: Abdominal workup   Dispo: Admit patient to Inpatient with  expected length of stay greater than 2 midnights.  Signed: Modena Slater, DO Internal Medicine Resident PGY-2 Pager: [phone] 09/09/2022, 7:12 AM   On weekends or after 5pm please page on call intern or resident: First contact: [phone] If no answer in 15 minutes, please contact senior pager at [phone]

## 2022-09-09 NOTE — Progress Notes (Signed)
HD#1 Subjective:   Summary: This is a 72 year old female with a past medical history of type 2 diabetes who presented to the emergency department with concerns of midepigastric pain.  With concern for cholecystitis, patient admitted for further evaluation management.  Overnight Events: No acute concerns overnight  Patient evaluated bedside this morning.  Patient states she is much better.  She denies any severe abdominal pain.  She states her pain went from a 9/10 to a 1/10 today.  She denies any vomiting, but does report being a little nauseous.  Much improved from yesterday however.  She has no other concerns this morning.  Did inform her about her diagnosis of diabetes today, which she expected.  Patient was met at that bedside with daughter and sister.  Objective:  Vital signs in last 24 hours: Vitals:   09/08/22 1200 09/08/22 1615 09/08/22 2217 09/09/22 0626  BP: 129/64 134/65 (!) 141/68 132/63  Pulse: (!) 108 91 81 91  Resp: 14 17  17   Temp:  99 F (37.2 C) 98.3 F (36.8 C) 99 F (37.2 C)  TempSrc:  Oral Oral Oral  SpO2: 93% 97% 97% 92%  Weight:      Height:       Supplemental O2: Room Air SpO2: 92 %   Physical Exam:  Constitutional: Resting in bed, no acute distress HENT: normocephalic atraumatic Pulmonary/Chest: normal work of breathing on room air Abdominal: soft, non-tender, non-distended, negative Murphy sign, normal active bowel sounds Extremities: No pitting edema bilaterally  Filed Weights   09/08/22 0727  Weight: 93.9 kg     Intake/Output Summary (Last 24 hours) at 09/09/2022 1610 Last data filed at 09/08/2022 2217 Gross per 24 hour  Intake 2409.68 ml  Output --  Net 2409.68 ml   Net IO Since Admission: 2,409.68 mL [09/09/22 0633]  Pertinent Labs:    Latest Ref Rng & Units 09/09/2022    3:15 AM 09/08/2022    8:16 AM 09/08/2022    7:57 AM  CBC  WBC 4.0 - 10.5 K/uL 9.5  11.1    Hemoglobin 12.0 - 15.0 g/dL 96.0  45.4  09.8    11.9   Hematocrit  36.0 - 46.0 % 38.0  40.5  42.0    41.0   Platelets 150 - 400 K/uL 224  214         Latest Ref Rng & Units 09/09/2022    3:15 AM 09/08/2022    5:45 PM 09/08/2022    8:16 AM  CMP  Glucose 70 - 99 mg/dL 147  829  562   BUN 8 - 23 mg/dL 9  8  10    Creatinine 0.44 - 1.00 mg/dL 1.30  8.65  7.84   Sodium 135 - 145 mmol/L 140  139  134   Potassium 3.5 - 5.1 mmol/L 3.6  3.9  3.5   Chloride 98 - 111 mmol/L 104  104  98   CO2 22 - 32 mmol/L 23  21  22    Calcium 8.9 - 10.3 mg/dL 8.6  8.6  8.8   Total Protein 6.5 - 8.1 g/dL 5.8   6.2   Total Bilirubin 0.3 - 1.2 mg/dL 3.9   2.4   Alkaline Phos 38 - 126 U/L 99   125   AST 15 - 41 U/L 208   396   ALT 0 - 44 U/L 248   185     Imaging: US Abdomen Limited RUQ (LIVER/GB)  Result Date: 09/08/2022 CLINICAL DATA:  Elevated LFTs and epigastric pain. EXAM: ULTRASOUND ABDOMEN LIMITED RIGHT UPPER QUADRANT COMPARISON:  CT from earlier today FINDINGS: Gallbladder: No gallstones, gallbladder sludge, or pericholecystic fluid. Equivocal wall thickening measuring between 2 and 3 mm. Positive sonographic Murphy's sign reported. Common bile duct: Diameter: 3.3 mm Liver: Did no focal lesion identified. Increased parenchymal echogenicity of the liver suggesting hepatic steatosis. Portal vein is patent on color Doppler imaging with normal direction of blood flow towards the liver. Other: None. IMPRESSION: 1. No gallstones or gallbladder sludge. Equivocal gallbladder wall thickening. Positive sonographic Murphy's sign reported. Imaging findings are nonspecific. If there is a high clinical concern for acute cholecystitis consider further investigation with nuclear medicine hepatic biliary scan. 2. Hepatic steatosis. Electronically Signed   By: Signa Kell M.D.   On: 09/08/2022 10:31   CT Angio Chest PE W and/or Wo Contrast  Result Date: 09/08/2022 CLINICAL DATA:  Epigastric pain, concern for pulmonary embolism. EXAM: CT ANGIOGRAPHY CHEST CT ABDOMEN AND PELVIS WITH CONTRAST  TECHNIQUE: Multidetector CT imaging of the chest was performed using the standard protocol during bolus administration of intravenous contrast. Multiplanar CT image reconstructions and MIPs were obtained to evaluate the vascular anatomy. Multidetector CT imaging of the abdomen and pelvis was performed using the standard protocol during bolus administration of intravenous contrast. RADIATION DOSE REDUCTION: This exam was performed according to the departmental dose-optimization program which includes automated exposure control, adjustment of the mA and/or kV according to patient size and/or use of iterative reconstruction technique. CONTRAST:  75mL OMNIPAQUE IOHEXOL 350 MG/ML SOLN COMPARISON:  None Available. FINDINGS: CTA CHEST FINDINGS Cardiovascular: Satisfactory opacification of the pulmonary arteries to the segmental level. No evidence of pulmonary embolism. The heart is mildly enlarged. No pericardial effusion. Mediastinum/Nodes: No enlarged mediastinal, hilar, or axillary lymph nodes. Thyroid gland, trachea, and esophagus demonstrate no significant findings. Lungs/Pleura: There is mild-to-moderate bilateral atelectasis. No pleural effusion or pneumothorax. Musculoskeletal: Degenerative changes are seen in the spine. Review of the MIP images confirms the above findings. CT ABDOMEN and PELVIS FINDINGS Hepatobiliary: The liver is diffusely hypoattenuating, consistent with hepatic steatosis. No focal liver abnormality is seen. No gallstones, gallbladder wall thickening, or biliary dilatation. Pancreas: Unremarkable. No pancreatic ductal dilatation or surrounding inflammatory changes. Spleen: Normal in size without focal abnormality. Adrenals/Urinary Tract: Adrenal glands are unremarkable. Kidneys are normal, without renal calculi, focal lesion, or hydronephrosis. Bladder is unremarkable. Stomach/Bowel: Stomach is within normal limits. Appendix appears normal. No evidence of bowel wall thickening, distention, or  inflammatory changes. Vascular/Lymphatic: Aortic atherosclerosis. No enlarged abdominal or pelvic lymph nodes. Reproductive: A uterine mass measuring 3.8 cm likely represents a fibroid. No adnexal mass. Other: No abdominal wall hernia or abnormality. No abdominopelvic ascites. Musculoskeletal: Degenerative changes are seen in the spine. Review of the MIP images confirms the above findings. IMPRESSION: 1. No evidence of pulmonary embolism. 2. No acute findings in the chest, abdomen or pelvis. 3. Hepatic steatosis. 4. Uterine mass likely represents a fibroid. Aortic Atherosclerosis (ICD10-I70.0). Electronically Signed   By: Romona Curls M.D.   On: 09/08/2022 09:27   CT ABDOMEN PELVIS W CONTRAST  Result Date: 09/08/2022 CLINICAL DATA:  Epigastric pain, concern for pulmonary embolism. EXAM: CT ANGIOGRAPHY CHEST CT ABDOMEN AND PELVIS WITH CONTRAST TECHNIQUE: Multidetector CT imaging of the chest was performed using the standard protocol during bolus administration of intravenous contrast. Multiplanar CT image reconstructions and MIPs were obtained to evaluate the vascular anatomy. Multidetector CT imaging of the abdomen and pelvis was performed using the standard protocol during  bolus administration of intravenous contrast. RADIATION DOSE REDUCTION: This exam was performed according to the departmental dose-optimization program which includes automated exposure control, adjustment of the mA and/or kV according to patient size and/or use of iterative reconstruction technique. CONTRAST:  75mL OMNIPAQUE IOHEXOL 350 MG/ML SOLN COMPARISON:  None Available. FINDINGS: CTA CHEST FINDINGS Cardiovascular: Satisfactory opacification of the pulmonary arteries to the segmental level. No evidence of pulmonary embolism. The heart is mildly enlarged. No pericardial effusion. Mediastinum/Nodes: No enlarged mediastinal, hilar, or axillary lymph nodes. Thyroid gland, trachea, and esophagus demonstrate no significant findings.  Lungs/Pleura: There is mild-to-moderate bilateral atelectasis. No pleural effusion or pneumothorax. Musculoskeletal: Degenerative changes are seen in the spine. Review of the MIP images confirms the above findings. CT ABDOMEN and PELVIS FINDINGS Hepatobiliary: The liver is diffusely hypoattenuating, consistent with hepatic steatosis. No focal liver abnormality is seen. No gallstones, gallbladder wall thickening, or biliary dilatation. Pancreas: Unremarkable. No pancreatic ductal dilatation or surrounding inflammatory changes. Spleen: Normal in size without focal abnormality. Adrenals/Urinary Tract: Adrenal glands are unremarkable. Kidneys are normal, without renal calculi, focal lesion, or hydronephrosis. Bladder is unremarkable. Stomach/Bowel: Stomach is within normal limits. Appendix appears normal. No evidence of bowel wall thickening, distention, or inflammatory changes. Vascular/Lymphatic: Aortic atherosclerosis. No enlarged abdominal or pelvic lymph nodes. Reproductive: A uterine mass measuring 3.8 cm likely represents a fibroid. No adnexal mass. Other: No abdominal wall hernia or abnormality. No abdominopelvic ascites. Musculoskeletal: Degenerative changes are seen in the spine. Review of the MIP images confirms the above findings. IMPRESSION: 1. No evidence of pulmonary embolism. 2. No acute findings in the chest, abdomen or pelvis. 3. Hepatic steatosis. 4. Uterine mass likely represents a fibroid. Aortic Atherosclerosis (ICD10-I70.0). Electronically Signed   By: Romona Curls M.D.   On: 09/08/2022 09:27    Assessment/Plan:   Principal Problem:   Epigastric pain Active Problems:   Morbid obesity (HCC)   Type 2 diabetes mellitus (HCC)   Patient Summary: DEISHA MOSBY is a 72 y.o.  female with a past medical history of type 2 diabetes who presented to the emergency department with concerns of midepigastric pain.  With concern for cholecystitis, patient admitted for further evaluation  management.  #Suspicion for choledocholithiasis, resolving #Elevated LFTs #Hyperbilirubinemia #Epigastric pain Patient evaluated bedside this morning.  Pain is much improved.  Patient did have some pain medicine overnight, but none this morning.  Patient denies any vomiting.  She states she is ready to eat.  She does report that without the pain medicine, she still feels much better.  HIDA scan showing normal gallbladder function.  Labs this morning show an increase bilirubin, and decreasing liver enzymes. Suspicion could be patient had cholestatic pattern in the setting of choledocholithiasis. This could be in the setting of stone.  I do feel that if patient did have a stone, patient passed it and that is why pain is much more improved.  Spoke with GI and General surgery who stated that they will watch liver enzymes and bilirubin for the next steps.  Currently, patient is stable.  No acute concerns for further workup at this time.  If patient does develop more pain, or stone is suspected, could proceed with MRCP. -Continue ceftriaxone -Continue metronidazole -Continue to monitor for further pain -GI following, appreciate recommendations -GS following, appreciate recommendation -Slowly start advancing diet, started on clears, can potentially go to full liquid tonight if patient is able and willing  #Type 2 diabetes mellitus, uncontrolled A1c resulted 11.7.  This is uncontrolled.  Will start 10 units of bedtime insulin today.  Will continue to follow-up blood sugars.  Will continue with sliding scale insulin.  Patient will need to be monitored outpatient for better glucose control. -Patient counseled on diabetic diet and importance of good glucose control today -Semglee 10 units nightly -Sliding scale insulin -Carb modified diet once patient is able to tolerate full diet -Monitor CBGs closely  #Concern for suicidal ideation Given patient was a HIDA scan today, patient was unable to be seen by  psychiatry.  Did speak with patient today, and she states she has had some stressors in life that have been weighing on her.  She states that she would like ideas on how to cope with this better. -Will follow-up psychiatry recommendations  Diet:  Clear diet, advance as tolerated IVF: None,None VTE: Enoxaparin Code: Full PT/OT recs: None, none Family Update: Daughter and sister updated at bedside   Dispo: Anticipated discharge to Home in 2 days pending clinical improvement   Modena Slater DO Internal Medicine Resident PGY-2 [phone] Please contact the on call pager after 5 pm and on weekends at [phone].

## 2022-09-10 DIAGNOSIS — R1013 Epigastric pain: Secondary | ICD-10-CM | POA: Diagnosis not present

## 2022-09-10 LAB — COMPREHENSIVE METABOLIC PANEL
ALT: 196 U/L — ABNORMAL HIGH (ref 0–44)
AST: 123 U/L — ABNORMAL HIGH (ref 15–41)
Albumin: 2.7 g/dL — ABNORMAL LOW (ref 3.5–5.0)
Alkaline Phosphatase: 111 U/L (ref 38–126)
Anion gap: 10 (ref 5–15)
BUN: 12 mg/dL (ref 8–23)
CO2: 21 mmol/L — ABNORMAL LOW (ref 22–32)
Calcium: 8.3 mg/dL — ABNORMAL LOW (ref 8.9–10.3)
Chloride: 103 mmol/L (ref 98–111)
Creatinine, Ser: 0.73 mg/dL (ref 0.44–1.00)
GFR, Estimated: >60 mL/min (ref >60)
Glucose, Bld: 177 mg/dL — ABNORMAL HIGH (ref 70–99)
Potassium: 3.6 mmol/L (ref 3.5–5.1)
Sodium: 134 mmol/L — ABNORMAL LOW (ref 135–145)
Total Bilirubin: 2.5 mg/dL — ABNORMAL HIGH (ref 0.3–1.2)
Total Protein: 5.6 g/dL — ABNORMAL LOW (ref 6.5–8.1)

## 2022-09-10 LAB — CBC
HCT: 38.2 % (ref 36.0–46.0)
Hemoglobin: 12.7 g/dL (ref 12.0–15.0)
MCH: 32.2 pg (ref 26.0–34.0)
MCHC: 33.2 g/dL (ref 30.0–36.0)
MCV: 96.7 fL (ref 80.0–100.0)
Platelets: 200 10*3/uL (ref 150–400)
RBC: 3.95 MIL/uL (ref 3.87–5.11)
RDW: 12.8 % (ref 11.5–15.5)
WBC: 7.2 10*3/uL (ref 4.0–10.5)
nRBC: 0 % (ref 0.0–0.2)

## 2022-09-10 LAB — GLUCOSE, CAPILLARY
Glucose-Capillary: 185 mg/dL — ABNORMAL HIGH (ref 70–99)
Glucose-Capillary: 210 mg/dL — ABNORMAL HIGH (ref 70–99)
Glucose-Capillary: 207 mg/dL — ABNORMAL HIGH (ref 70–99)
Glucose-Capillary: 190 mg/dL — ABNORMAL HIGH (ref 70–99)

## 2022-09-10 MED ORDER — LIVING WELL WITH DIABETES BOOK
Freq: Once | Status: AC
  Filled 2022-09-10: qty 1

## 2022-09-10 MED ORDER — SODIUM CHLORIDE 0.45 % IV SOLN: INTRAVENOUS | Status: AC

## 2022-09-10 MED ORDER — INSULIN STARTER KIT- PEN NEEDLES (ENGLISH)
1.0000 | Freq: Once | Status: AC
  Administered 2022-09-11: 1
  Filled 2022-09-10: qty 1

## 2022-09-10 NOTE — TOC Initial Note (Signed)
Transition of Care (TOC) - Initial/Assessment Note   Spoke to patient and son Minerva Areola at bedside.   Patient from home alone. Has 3 sons   PT recommended no follow up , but did recommend a walker. Patient reports she has a walker at home already  Patient Details  Name: Robin Tucker MRN: [id number] Date of Birth: [birth year]  Transition of [facility] CM/SW Contact:    Kingsley Plan, RN Phone Number: 09/10/2022, 1:06 PM  Clinical Narrative:                   Expected Discharge Plan: Home/Self Care Barriers to Discharge: Continued Medical Work up   Patient Goals and CMS Choice Patient states their goals for this hospitalization and ongoing recovery are:: to return to home          Expected Discharge Plan and Services   Discharge Planning Services: CM Consult Post Acute Care Choice: NA Living arrangements for the past 2 months: Single Family Home                 DME Arranged: N/A         HH Arranged: NA          Prior Living Arrangements/Services Living arrangements for the past 2 months: Single Family Home Lives with:: Self Patient language and need for interpreter reviewed:: Yes Do you feel safe going back to the place where you live?: Yes      Need for Family Participation in Patient Care: Yes (Comment) Care giver support system in place?: Yes (comment) Current home services: DME Criminal Activity/Legal Involvement Pertinent to Current Situation/Hospitalization: No - Comment as needed  Activities of Daily Living      Permission Sought/Granted   Permission granted to share information with : Yes, Verbal Permission Granted  Share Information with NAME: son eric           Emotional Assessment Appearance:: Appears stated age Attitude/Demeanor/Rapport: Engaged Affect (typically observed): Accepting Orientation: : Oriented to Self, Oriented to Place, Oriented to  Time, Oriented to Situation      Admission diagnosis:  Epigastric pain  [R10.13] Hyperglycemia [R73.9] Elevated LFTs [R79.89] SIRS (systemic inflammatory response syndrome) (HCC) [R65.10] Type 2 diabetes mellitus without complication, without long-term current use of insulin (HCC) [E11.9] Patient Active Problem List   Diagnosis Date Noted   Epigastric pain 09/08/2022   Type 2 diabetes mellitus (HCC) 09/08/2022   Nausea with vomiting 01/03/2014   Morbid obesity (HCC) 01/02/2014   Vertigo    HYPERGLYCERIDEMIA, PURE 06/26/2006   ANXIETY STATE NOS 06/26/2006   PCP:  Shirline Frees, NP Pharmacy:   [facility] [address], [place] - [address] [place] [address] Phone: [phone] Fax: [phone]     Social Determinants of Health (SDOH) Social History: SDOH Screenings   Tobacco Use: Low Risk  (09/08/2022)   SDOH Interventions:     Readmission Risk Interventions     No data to display

## 2022-09-10 NOTE — Progress Notes (Signed)
Subjective:   Maimouna Apley is a 72 year old female with a past medical history of untreated diabetes type 2 who presents with 8 out of 10 epigastric pain and admitted for abdominal pain workup.  Leading diagnosis is acalculous cholecystitis.  Hospital day 3.  NAEON.  No refusals.  PRNs: Tylenol 650 mg x1, Dilaudid 0.5 x1, Compazine 5 mg x2.  New  On multiple interviews, patient's says that she is feeling worse than yesterday including: nausea/aching.  Denies vomitus this morning, but received 2 times Compazine 5 mg for persistent nausea.  Stomach continues to be uncomfortable, but is outmatched by body aches.  Patient initially was concerned about not being able to order food.  On second interview with later, patient had eaten approximately half of her lunch.  On reinterview with son in room, the patient had gotten some food down but was stopped by continued nausea. Patient is concerned about being treated as if she is COVID-positive.  Team explained to patient that as she refused COVID-19 screening, team will maintain COVID precautions for the remainder of stay.  Patient amenable to this.  Patient says that she is woozy and does not feel well enough to leave the hospital -- has a historical diagnosis of vertigo, which patient says has worsened since admission.  Physical therapy was in room preparing to assess during second interview.  Declined pastoral care consult to address mood//stress issues while in hospital.  Amenable to diabetes education.  Objective:  BP: 150/66.  HR 82.  RR 17.  Afebrile.  Satting 93% on room air.  Vital signs in last 24 hours: Vitals:   09/09/22 1601 09/09/22 2209 09/10/22 0619 09/10/22 0817  BP: (!) 138/56 (!) 148/71 (!) 120/56 (!) 150/66  Pulse: 79 84 80 82  Resp: 18  18 17   Temp: 98.8 F (37.1 C) 98.5 F (36.9 C) 98.4 F (36.9 C) 98.8 F (37.1 C)  TempSrc: Oral  Oral Oral  SpO2: 95% 94% 92% 93%  Weight:      Height:       Constitutional: Well  appearing, well-nourished, in mild distress. Cardiovascular: Regular rate and rhythm, no murmurs rubs or gallops. Respiratory: Clear to auscultation bilaterally. Abdominal: Some tenderness in epigastric region to light palpation.  Bowel sounds present. MSK: Normal limits, multiple red birthmarks, unchanged. Neuro: Grossly within normal limits. Psych: Appropriate mood and affect.  Pertinent labs: AST: 123 (208) ALT: 196 (248) Glucose: 177 (262) Alk phos: 111 within normal limits Total bilirubin: 2.5 (3.8)  Pertinent imaging:  HIDA scan 8/18:  IMPRESSION: 1. Patent cystic duct without evidence for acute cholecystitis. 2. No signs of biliary obstruction.  Assessment/Plan:  Principal Problem:   Epigastric pain Active Problems:   Morbid obesity (HCC)   Type 2 diabetes mellitus (HCC)  #Suspicion for choledocholithiasis #Elevated LFTs #Hyperbilirubinemia hide #Epigastric pain #Body aches   Patient LFTs returning to baseline.  Total bilirubin: 2.5 from 3.8.  Some residual epigastric aching, no new symptoms.  HIDA scan yesterday negative.  Likeliest diagnosis continues to be cholestatic pattern in setting of choledocholithiasis, +/- passed stone.  We will continue ceftriaxone (8/17 -) and metronidazole (8/17 -) while inpatient, and switch to Augmentin before discharge for adequate anaerobic/gram-negative coverage.  Patient is requiring Tylenol and Dilaudid PRNs for pain and new onset body aches.  Begun 100 mL/h normal saline infusion for 10 hrs for volume repletion.  GI and general surgery are signed off at this time. - Continue 7-day ceftriaxone and metronidazole (8/17 -), will switch  to Augmentin before discharge, per GI recs - Pain meds as needed. - Began 1 L normal saline infusion today over 10 hours. - We will continue to trend LFTs over duration of stay, improving as of 8/19.  #Nausea #Vertigo #Type II DM Patient has complained of continuing nausea and vertigo since  admission, which has recently worsened over the past 24 hours.  Unclear etiology.  Patient has required Compazine 5 mg x2 over the past 24 hours.  Patient is also notably less steady and does not feel safe to go home from the hospital.  Awaiting updated PT consult.  Per patient, has a diagnosis of vertigo from 2016.  Patient has walker at home already dizziness unlikely due to hypoglycemia, as patient blood glucoses are ranging between 100s/200s.  Placed PT vestibular eval. - Continue IV Compazine 5 mg every 4 hours as needed for nausea and vomiting. - Ordered PT vestibular eval for vertigo. - Continue MSSI and insulin glargine 10 units. - Ordered diabetes education consult.  #Concern for suicidal ideation Per psych plan of care note, no acute indication for psych consult at this time.  Patient declined chaplain consult earlier today.  Will discuss options for outpatient individual therapy/medication management before discharge.  Patient has numerous protective factors which lower imminent risk of harm to self. - Discuss outpatient psychiatric follow-up options before discharge. - Consider starting SSRI versus Wellbutrin before discharge.  Prior to Admission Living Arrangement: Home Anticipated Discharge Location: Home Barriers to Discharge: Continued nausea Dispo: Anticipated discharge in approximately 1-2 day(s), primary barrier is continued vertigo/unsteadiness.  Tomie China, MD 09/10/2022, 2:01 PM Pager: [phone] After 5pm on weekdays and 1pm on weekends: On Call pager [phone]

## 2022-09-10 NOTE — Progress Notes (Signed)
Physical Therapy Treatment Patient Details Name: Robin Tucker MRN: [id number] DOB: 02-09-1950 Today's Date: 09/10/2022   History of Present Illness Pt is a 72 y.o. female presenting 09/08/2022 with epigastric pain, nausea, an vomiting. Labs notable for  slight elevation white count at 11, lipase 57, glucose over 400, AST 396, ALT 185, alk phos 125, total bili 2.4, Lactate 2.6. Found to have questionable gallbladder thickening. HIDA scan 8/18 normal. PMH significant for untreated DMII    PT Comments  Continuing work on functional mobility and activity tolerance;  Session focused on progressive amb with RW, with good progress of gait distance; walked to the bathroom first, where pt was able to move her bowels (small amount of formed stool) and void; then pt walked the hallway with bil UE support from RW, and son pushing recliner behind her to boost her confidence to push the distance, knowing that she has the option to sit at anytime; she reports generalized malaise and fatigue this afternoon;  Appreciate Vestibular Eval order (thanks!); if she is feeling the same tomorrow as she is today, I'm not sure that she will tolerate VOR and positional testing; She can undergo PT Vestibular eval as an outpt if needed   If plan is discharge home, recommend the following: A little help with walking and/or transfers;A little help with bathing/dressing/bathroom;Help with stairs or ramp for entrance   Can travel by private vehicle        Equipment Recommendations  None recommended by PT (already has RW)    Recommendations for Other Services       Precautions / Restrictions Precautions Precautions: Fall;Other (comment) Precaution Comments: Air/Con Prec for possible Covid (pt declining screening Restrictions Weight Bearing Restrictions: No     Mobility  Bed Mobility Overal bed mobility: Needs Assistance Bed Mobility: Supine to Sit     Supine to sit: Supervision     General bed mobility  comments: increased time and HOB elevated    Transfers Overall transfer level: Needs assistance Equipment used: Rolling walker (2 wheels) Transfers: Sit to/from Stand Sit to Stand: Contact guard assist           General transfer comment: Close guard for safety as pt reports feeling weak, no physical contact needed    Ambulation/Gait Ambulation/Gait assistance: Contact guard assist, Supervision Gait Distance (Feet): 200 Feet Assistive device: Rolling walker (2 wheels) Gait Pattern/deviations: Step-through pattern, Decreased step length - right, Decreased step length - left, Decreased stride length Gait velocity: decreased     General Gait Details: More steady wit bil UE support from RW; no overt reports of spinning sensations, reporting feeling weak; son pushed chair behind for safety, and to boost pt's confidence for progressive amb; contactguard/standby assist progressing to supervision; fatigued at end of walk, with eyes closing intermittently, but did not need a sitting or standing rest break   Stairs             Wheelchair Mobility     Tilt Bed    Modified Rankin (Stroke Patients Only)       Balance     Sitting balance-Leahy Scale: Good       Standing balance-Leahy Scale: Fair                              Cognition Arousal: Alert Behavior During Therapy: WFL for tasks assessed/performed Overall Cognitive Status: Within Functional Limits for tasks assessed  Exercises      General Comments General comments (skin integrity, edema, etc.): Session conducted on room air      Pertinent Vitals/Pain Pain Assessment Pain Assessment: Faces Faces Pain Scale: Hurts little more Pain Location: R shoulder, plus generalized aches Pain Descriptors / Indicators: Aching, Discomfort Pain Intervention(s): Monitored during session    Home Living                          Prior  Function            PT Goals (current goals can now be found in the care plan section) Acute Rehab PT Goals Patient Stated Goal: return to independence PT Goal Formulation: With patient Time For Goal Achievement: 09/23/22 Potential to Achieve Goals: Good Progress towards PT goals: Progressing toward goals    Frequency    Min 1X/week      PT Plan      Co-evaluation              AM-PAC PT "6 Clicks" Mobility   Outcome Measure  Help needed turning from your back to your side while in a flat bed without using bedrails?: None Help needed moving from lying on your back to sitting on the side of a flat bed without using bedrails?: None Help needed moving to and from a bed to a chair (including a wheelchair)?: None Help needed standing up from a chair using your arms (e.g., wheelchair or bedside chair)?: A Little Help needed to walk in hospital room?: A Little Help needed climbing 3-5 steps with a railing? : A Little 6 Click Score: 21    End of Session Equipment Utilized During Treatment: Gait belt Activity Tolerance: Patient tolerated treatment well;Patient limited by fatigue Patient left: in bed;with call bell/phone within reach;with family/visitor present Nurse Communication: Mobility status PT Visit Diagnosis: Other abnormalities of gait and mobility (R26.89);Unsteadiness on feet (R26.81)     Time: 1610-9604 PT Time Calculation (min) (ACUTE ONLY): 41 min  Charges:    $Gait Training: 23-37 mins $Therapeutic Activity: 8-22 mins PT General Charges $$ ACUTE PT VISIT: 1 Visit                     Van Clines, PT  Acute Rehabilitation Services Office [phone] Secure Chat welcomed    Levi Aland 09/10/2022, 5:35 PM

## 2022-09-10 NOTE — Consult Note (Addendum)
Reason for Consult: Psychiatric consultation was requested for the patient based on reports from her sister regarding possible suicidal ideation. The sister noted that the patient "has been talking about suicide often with no plan that I know of. The only reason she hasn't done it yet is because she thinks she will go to hell"  Chart Review: The patient has not endorsed suicidal ideations during this hospitalization. She did report feeling overwhelmed by several issues and expressed a desire to speak with someone about coping strategies.  Assessment: The patient has protective factors, including a supportive family, knowledge of how to access emergency services, motivated to do better,  future oriented, and a strong religious/spiritual background. Based on the chart review and current assessment, there are no acute indicators that would necessitate an inpatient psychiatric consult at this time.  Recommendations: Outpatient Therapy: A referral for outpatient individual therapy is recommended to help the patient address her emotions and develop coping skills. Outpatient follow-up for psychiatric services for medication management and psychotherapy is advised.  Consider safety plan: A safety plan which includes the patient following up with psychiatric services and being educated on available mental health resources and crisis services in the community. The patient can be instructed to call [phone] or present to the nearest emergency room if they experience any suicidal ideation, homicidal ideation, auditory/visual hallucinations, or a significant worsening of their mental health condition.  Protective Factors: The patient has several protective factors, including treatable psychiatric disorders and symptoms, future-oriented thinking, a reality-based mindset. These factors, when considered alongside the patient's current risk factors, suggest that the patient is at low imminent risk of harm to self or  others.  Chaplain Consult: Consideration should be given to a chaplain consult for spiritual healing and prayer. Conclusion:  Given the above, an inpatient psychiatric consultation is not recommended at this time. Outpatient follow-up is more appropriate to address the patient's needs and mitigate potential risks.  Will DC psych consult at this time. Please reach out if patient suggests suicidality outside of what has already been noted. Discussed with primary team.

## 2022-09-10 NOTE — Inpatient Diabetes Management (Signed)
Inpatient Diabetes Program Recommendations  AACE/ADA: New Consensus Statement on Inpatient Glycemic Control (2015)  Target Ranges:  Prepandial:   less than 140 mg/dL      Peak postprandial:   less than 180 mg/dL (1-2 hours)      Critically ill patients:  140 - 180 mg/dL   Lab Results  Component Value Date   GLUCAP 190 (H) 09/10/2022   HGBA1C 11.7 (H) 09/09/2022    Review of Glycemic Control  Diabetes history: DM2 Outpatient Diabetes medications: None Current orders for Inpatient glycemic control: Semglee 10 units at bedtime, Novolog 0-15 units TID with meals and 0-5 HS  HgbA1C 11.7% CBGs 185, 210, 207  Inpatient Diabetes Program Recommendations:    Consider increasing Semglee to 15 units at bedtime  Consider adding Novolog 5 units TID with meals if eating > 50%  Spoke with pt at bedside regarding diabetes diagnosis and HgbA1C of 11.7%. Discussed importance of controlling blood sugars to reduce risks of complications. Also discussed lifestyle modification with diet, exercise and weight loss to improve glycemic control. Educated patient on insulin pen use at home. Ordered insulin flexpen starter kit. Reviewed all steps if insulin pen including attachment of needle, 2-unit air shot, dialing up dose, giving injection, removing needle, disposal of sharps, storage of unused insulin, disposal of insulin etc. Patient able to provide successful return demonstration. Also reviewed troubleshooting with insulin pen. MD to give patient Rxs for insulin pens and insulin pen needles. Pt also need assistance with obtaining PCP for diabetes management. Ordered TOC consult. Will follow-up with pt in am.  Thank you. Ailene Ards, RD, LDN, CDCES Inpatient Diabetes Coordinator [phone]

## 2022-09-11 DIAGNOSIS — R1013 Epigastric pain: Secondary | ICD-10-CM | POA: Diagnosis not present

## 2022-09-11 DIAGNOSIS — R42 Dizziness and giddiness: Secondary | ICD-10-CM | POA: Diagnosis not present

## 2022-09-11 DIAGNOSIS — R110 Nausea: Secondary | ICD-10-CM

## 2022-09-11 DIAGNOSIS — E876 Hypokalemia: Secondary | ICD-10-CM

## 2022-09-11 LAB — CBC
HCT: 37.4 % (ref 36.0–46.0)
Hemoglobin: 12.5 g/dL (ref 12.0–15.0)
MCH: 31.9 pg (ref 26.0–34.0)
MCHC: 33.4 g/dL (ref 30.0–36.0)
MCV: 95.4 fL (ref 80.0–100.0)
Platelets: 212 10*3/uL (ref 150–400)
RBC: 3.92 MIL/uL (ref 3.87–5.11)
RDW: 12.6 % (ref 11.5–15.5)
WBC: 7 10*3/uL (ref 4.0–10.5)
nRBC: 0.4 % — ABNORMAL HIGH (ref 0.0–0.2)

## 2022-09-11 LAB — COMPREHENSIVE METABOLIC PANEL
ALT: 163 U/L — ABNORMAL HIGH (ref 0–44)
AST: 79 U/L — ABNORMAL HIGH (ref 15–41)
Albumin: 2.8 g/dL — ABNORMAL LOW (ref 3.5–5.0)
Alkaline Phosphatase: 114 U/L (ref 38–126)
Anion gap: 10 (ref 5–15)
BUN: 11 mg/dL (ref 8–23)
CO2: 22 mmol/L (ref 22–32)
Calcium: 8.9 mg/dL (ref 8.9–10.3)
Chloride: 103 mmol/L (ref 98–111)
Creatinine, Ser: 0.71 mg/dL (ref 0.44–1.00)
GFR, Estimated: >60 mL/min (ref >60)
Glucose, Bld: 191 mg/dL — ABNORMAL HIGH (ref 70–99)
Potassium: 3.3 mmol/L — ABNORMAL LOW (ref 3.5–5.1)
Sodium: 135 mmol/L (ref 135–145)
Total Bilirubin: 1.7 mg/dL — ABNORMAL HIGH (ref 0.3–1.2)
Total Protein: 5.6 g/dL — ABNORMAL LOW (ref 6.5–8.1)

## 2022-09-11 LAB — GLUCOSE, CAPILLARY
Glucose-Capillary: 228 mg/dL — ABNORMAL HIGH (ref 70–99)
Glucose-Capillary: 159 mg/dL — ABNORMAL HIGH (ref 70–99)
Glucose-Capillary: 165 mg/dL — ABNORMAL HIGH (ref 70–99)
Glucose-Capillary: 155 mg/dL — ABNORMAL HIGH (ref 70–99)

## 2022-09-11 LAB — MAGNESIUM: Magnesium: 1.7 mg/dL (ref 1.7–2.4)

## 2022-09-11 MED ORDER — TRIMETHOBENZAMIDE HCL 100 MG/ML IM SOLN
200.0000 mg | Freq: Four times a day (QID) | INTRAMUSCULAR | Status: DC | PRN
  Administered 2022-09-11: 200 mg via INTRAMUSCULAR
  Filled 2022-09-11 (×2): qty 2

## 2022-09-11 MED ORDER — METOCLOPRAMIDE HCL 5 MG/ML IJ SOLN
5.0000 mg | Freq: Four times a day (QID) | INTRAMUSCULAR | Status: DC
  Administered 2022-09-11 – 2022-09-13 (×9): 5 mg via INTRAVENOUS
  Filled 2022-09-11 (×8): qty 2

## 2022-09-11 MED ORDER — POTASSIUM CHLORIDE CRYS ER 20 MEQ PO TBCR
40.0000 meq | EXTENDED_RELEASE_TABLET | Freq: Two times a day (BID) | ORAL | Status: AC
  Administered 2022-09-11 – 2022-09-12 (×3): 40 meq via ORAL
  Filled 2022-09-11 (×3): qty 2

## 2022-09-11 MED ORDER — MAGNESIUM SULFATE 2 GM/50ML IV SOLN
2.0000 g | Freq: Once | INTRAVENOUS | Status: AC
  Administered 2022-09-11: 2 g via INTRAVENOUS
  Filled 2022-09-11: qty 50

## 2022-09-11 MED ORDER — INSULIN GLARGINE-YFGN 100 UNIT/ML ~~LOC~~ SOLN
14.0000 [IU] | Freq: Every day | SUBCUTANEOUS | Status: DC
  Administered 2022-09-11 – 2022-09-12 (×2): 14 [IU] via SUBCUTANEOUS
  Filled 2022-09-11 (×3): qty 0.14

## 2022-09-11 MED ORDER — PANTOPRAZOLE SODIUM 40 MG IV SOLR
40.0000 mg | Freq: Once | INTRAVENOUS | Status: AC
  Administered 2022-09-11: 40 mg via INTRAVENOUS
  Filled 2022-09-11: qty 10

## 2022-09-11 MED ORDER — AMOXICILLIN-POT CLAVULANATE 875-125 MG PO TABS
1.0000 | ORAL_TABLET | Freq: Two times a day (BID) | ORAL | Status: DC
  Administered 2022-09-11 – 2022-09-13 (×4): 1 via ORAL
  Filled 2022-09-11 (×4): qty 1

## 2022-09-11 NOTE — Progress Notes (Addendum)
Subjective:   Robin Tucker is a 72 year old female who presented with sudden onset epigastric pain (acalculous cholecystitis vs choledocholithiasis, resolved) and previously undiagnosed type 2 diabetes (A1c 11.7%) who now presents with 2 days of worsening nausea and vomiting since being in the hospital.  No acute events overnight.  Patient received the following PRNs: Tylenol 650 mg x1, Dilaudid 0.5 mg x1, Compazine 5 mg x1, Tigan 200 mg x1.  No medication refusals.  On interview, patient has been vomiting overnight.  Minimal p.o. intake.  Patient was dry heaving on interview.  Patient had felt somewhat better on Sunday, but has worsened over the past 48 hours.  Mentioned "the same" aching pain in chest that had previously presented epigastric pain, now resolved.  Denies crushing, sharp, sudden chest pain.  Discussed possible etiology of continued nausea with patient, including gastroparesis secondary to newly diagnosed type 2 diabetes.  Minimal improvement in nausea from Compazine and Tigan PRNs.  Patient also endorses vertigo on changing position, but that nausea is present throughout.  Objective:  Blood pressure: 168/68.  Heart rate 73.  Satting 96% on room air.  Afebrile.  Vital signs in last 24 hours: Vitals:   09/10/22 1649 09/10/22 1959 09/11/22 0428 09/11/22 0815  BP: 134/67 (!) 153/72 (!) 168/68 (!) 150/68  Pulse: 77 80 73 79  Resp: 16   18  Temp: 98.2 F (36.8 C) 98.8 F (37.1 C) 98.4 F (36.9 C) 98.5 F (36.9 C)  TempSrc: Oral Oral Oral Oral  SpO2: 98% 100% 96% 98%  Weight:      Height:       Constitutional: In some distress, moderately attentive to interview, dry heaving, follows commands Cardiovascular: Regular rate and rhythm, no murmurs rubs or gallops Respiratory: Clear to auscultation bilaterally Abdominal: Bowel sounds present, no tenderness/guarding on light palpation in 4 quadrants MSK: Within normal limits Neuro: Grossly intact Psychiatry: Appropriate  mood, anxious affect  Pertinent Labs: K: 3.3 (3.6) AST: 79 (123) ALT: 163 (196) Bili: 1.7 (2.5) Mg: 1.7 Glucose: 191 (177)  Pertinent Imaging:  EKG 8/20: NSR, rightward axis.  QTc: 446.  Assessment/Plan:  Principal Problem:   Epigastric pain Active Problems:   Morbid obesity (HCC)   Type 2 diabetes mellitus (HCC)  #Nausea #Vertigo #Hypokalemia Patient notes worsening nausea and refractory to Compazine and Tigan.  Patient has a previous diagnosis of vertigo from 2016, and has had similar spells with attendant nausea -- however, this persistent nausea is new for her.  Differential at this time includes gastroparesis secondary to uncontrolled diabetes vs IV antibiotics.  Placed inpatient vestibular eval, but may be limited by extent of patient's nausea.  Patient was dry heaving in room on interview.  Poor p.o. intake and persistent vomiting could explain newfound hypokalemia.  Repeat EKG showed QTc of 446, leaving room for QTc prolonging antinausea meds.  We will continue to monitor patient overnight and recheck potassium in the morning.  As patient does not show new epigastric symptoms as well as improving LFTs/bilirubin, low suspicion for recurrent GI process. - Begin scheduled IV metoclopramide 5 mg every 6 hours for nausea and vomiting. - Continue TIGAN 200mg  every 6 hours as needed for nausea and vomiting. - Continue prochlorperazine 5 mg every 4 hours as needed for nausea and vomiting. - PT vestibular evaluation placed, may be limited by extent of patient nausea.  If this fails, this can be performed outpatient with PCP referral. - Began potassium chloride 40 mEq twice daily and recheck potassium with  a.m. labs 8/21.   #Type II DM  Glucoses ranging from 179-228 over the last 24 hours.  Began on 10 units insulin glargine nightly last night.  Inpatient diabetes coordinator saw yesterday, suggested increase of glargine to 15 units and adding NovoLog 5 units 3 times daily with meals.   Patient able to provide successful return demonstration per note.  TOC consult ordered.  Coordinator will follow up again today 8/20. - Increase insulin glargine to 14 units nightly. - Continue moderate sliding scale insulin regimen while in hospital, will likely discharge on NovoLog 4 units 3 times daily with meals. - We will defer adding metformin at this time given her persistent nausea. - Diabetes educator to follow up today.  Appreciate recs. - Scheduled outpatient follow-up with internal medicine center with Annett Fabian, MD on September 5, 8:45 AM.  #Suspicion for choledocholithiasis #Elevated LFTs #Hyperbilirubinemia  #Epigastric pain #Body aches LFTs and bilirubin continue to trend downwards.  Noted some repeated epigastric pain last night, which spread to the chest, some concern for upper GI source.  Continues to experience body aches.  Received as needed Tylenol 650 mg x1 as well as Dilaudid 0.5 mg every 6 hours.  Plan continues to be to transition to PO augmentin to complete 7-day antibiotic course.  For now, will remain on IV ceftriaxone and metronidazole.  No clinical concern for continued biliary/gallbladder process. - Begin IV Protonix 40 mg x1, we will consider additional dosing tomorrow. - Continue as needed Dilaudid 0.5 mg every 6 hours for severe pain - Continue acetaminophen 650 mg every 6 hours as needed for mild pain.  #Concern for suicidal ideation  Deferred conversation about outpatient psychotherapy/medication management as family was in room and patient will be staying with [place] for at least another night.  Will discuss before discharge.  Prior to Admission Living Arrangement: Home Anticipated Discharge Location: Home Barriers to Discharge: Nausea of uncertain etiology Dispo: Anticipated discharge in approximately 1 to 2 days.   Tomie China, MD 09/11/2022, 10:53 AM Pager: [phone] After 5pm on weekdays and 1pm on weekends: On Call pager [phone]

## 2022-09-11 NOTE — Plan of Care (Signed)
  Problem: Pain Managment: Goal: General experience of comfort will improve Outcome: Progressing   Problem: Safety: Goal: Ability to remain free from injury will improve Outcome: Progressing   

## 2022-09-11 NOTE — TOC Progression Note (Signed)
Transition of Care (TOC) - Progression Note   Received secure chat from attending team. Patient is inactive with her PCP x 3 years . She will need a follow up appointment. Called attending's clinic and scheduled follow up appointment. Information on AVS.   Dr Merrilee Jansky will enter order for OP Vestibular PT  Patient Details  Name: Robin Tucker MRN: [id number] Date of Birth: October 22, 1950  Transition [facility]) CM/SW Contact  Sudiksha Victor, Adria Devon, RN Phone Number: 09/11/2022, 10:41 AM  Clinical Narrative:       Expected Discharge Plan: Home/Self Care Barriers to Discharge: Continued Medical Work up  Expected Discharge Plan and Services   Discharge Planning Services: CM Consult Post Acute Care Choice: NA Living arrangements for the past 2 months: Single Family Home                 DME Arranged: N/A         HH Arranged: NA           Social Determinants of Health (SDOH) Interventions SDOH Screenings   Tobacco Use: Low Risk  (09/08/2022)    Readmission Risk Interventions     No data to display

## 2022-09-11 NOTE — Progress Notes (Signed)
Occupational Therapy Note/ Cancellation Patient Details Name: ASIMINA CROCK MRN: [id number] DOB: Jul 26, 1950 Today's Date: 09/11/2022   History of present illness Pt is a 72 y.o. female presenting 09/08/2022 with epigastric pain, nausea, an vomiting. Labs notable for  slight elevation white count at 11, lipase 57, glucose over 400, AST 396, ALT 185, alk phos 125, total bili 2.4, Lactate 2.6. Found to have questionable gallbladder thickening. HIDA scan 8/18 normal. PMH significant for untreated DMII   OT comments  Pt sitting on EOB upon therapy arrival with family member in room visiting. Pt reports increased nausea with no results. Pt educated and provided with alcohol swab to help decrease symptoms. Pt reports no effect. Checked pt's chart to confirm when she may receive additional nausea medication. Pt was due for medication and requested from nursing. Pt declined to participate in skilled OT services this date d/t increased nausea. OT will follow up with pt tomorrow and attempt treatment if pt is able. Pt verbalized understanding.         Time: 4098-1191 OT Time Calculation (min): 12 min  Charges: OT General Charges $OT Visit: 1 Visit  Limmie Patricia, OTR/L,CBIS  Supplemental OT - [facility] and [facility] Secure Chat Preferred    Alianna Wurster, Charisse March 09/11/2022, 3:38 PM

## 2022-09-11 NOTE — Progress Notes (Signed)
Physical Therapy Treatment Patient Details Name: Robin Tucker MRN: [id number] DOB: May 21, 1950 Today's Date: 09/11/2022   History of Present Illness Pt is a 72 y.o. female presenting 09/08/2022 with epigastric pain, nausea, an vomiting. Labs notable for  slight elevation white count at 11, lipase 57, glucose over 400, AST 396, ALT 185, alk phos 125, total bili 2.4, Lactate 2.6. Found to have questionable gallbladder thickening. HIDA scan 8/18 normal. PMH significant for untreated DMII    PT Comments  Pt is limited by fatigue at this time, as well as intermittent dizziness. Vestibular assessment is documented below in general comments. Pt has been dealing with vestibular symptoms for multiple years, with increased intensity during this admission. Pt declines formal assessment for BPPV this session due to fear of further nausea and vomiting. Based on pt's history a vestibular hypofunction would appear more likely than BPPV as BPPV would not typically last for multiple years. Pt has implemented her own compensatory techniques to manager her vestibular symptoms in the past, including increased time between positional changes and reduced speed of hear turns and changes in direction. PT encourages continued use of these mechanisms for stability. Pt will benefit from further vestibular assessment and treatment as she is able to tolerate. Pt remains able to ambulate with support of RW at this time within the hospital room.    If plan is discharge home, recommend the following: A little help with bathing/dressing/bathroom;Assistance with cooking/housework;Assist for transportation;Help with stairs or ramp for entrance   Can travel by private vehicle        Equipment Recommendations  None recommended by PT (already owns RW)    Recommendations for Other Services       Precautions / Restrictions Precautions Precautions: Fall;Other (comment) Precaution Comments: Air/Con Prec for possible Covid (pt  declining screening Restrictions Weight Bearing Restrictions: No     Mobility  Bed Mobility Overal bed mobility: Needs Assistance Bed Mobility: Rolling, Sidelying to Sit Rolling: Modified independent (Device/Increase time) Sidelying to sit: Modified independent (Device/Increase time)       General bed mobility comments: increased time, short break after rolling due to dizziness, HOB elevated    Transfers Overall transfer level: Needs assistance Equipment used: Rolling walker (2 wheels) Transfers: Sit to/from Stand Sit to Stand: Contact guard assist                Ambulation/Gait Ambulation/Gait assistance: Supervision Gait Distance (Feet): 40 Feet Assistive device: Rolling walker (2 wheels) Gait Pattern/deviations: Step-through pattern Gait velocity: reduced Gait velocity interpretation: <1.8 ft/sec, indicate of risk for recurrent falls   General Gait Details: slowed step-through gait, increased time to turn with slow head movements. pt reports this is typical at baseline due to her chronic vestibular symptoms   Stairs             Wheelchair Mobility     Tilt Bed    Modified Rankin (Stroke Patients Only)       Balance Overall balance assessment: Needs assistance Sitting-balance support: No upper extremity supported, Feet supported Sitting balance-Leahy Scale: Fair     Standing balance support: Single extremity supported, Reliant on assistive device for balance Standing balance-Leahy Scale: Poor                              Cognition Arousal: Alert Behavior During Therapy: WFL for tasks assessed/performed Overall Cognitive Status: Within Functional Limits for tasks assessed  Exercises      General Comments General comments (skin integrity, edema, etc.): Vestibular assessment: pt performs smooth pursuits and saccades without symptoms or notes of nystagmus. Pt with very  slowed VOR horizontal and vertical, pt reports she feels she will begin to have the room spin if performing any quicker, no nystagmus noted at slowed speeds. Pt declines any further vestibular evaluation at this time. Pt describes her symptoms when they occur as the room spinning around her. These symptoms most frequentlyu occur when performing bed mobility or changing direction quickly. They subside with cessation of movement within a short period of time (1-2 minutes or less typically). Pt reports these symptoms have lasted for multiple years, with gradually reducing intensity until this admission. Pt does not report any acute vision changes and no recent ENT infections. Pt has compensated for these deficits over the last few years with increased time between positional changes with with slower turns, limiting quick changes in head position.      Pertinent Vitals/Pain Pain Assessment Pain Assessment: Faces Faces Pain Scale: Hurts little more Pain Location: R shoulder Pain Descriptors / Indicators: Aching Pain Intervention(s): Monitored during session    Home Living                          Prior Function            PT Goals (current goals can now be found in the care plan section) Acute Rehab PT Goals Patient Stated Goal: return to independence Progress towards PT goals: Progressing toward goals    Frequency    Min 1X/week      PT Plan      Co-evaluation              AM-PAC PT "6 Clicks" Mobility   Outcome Measure  Help needed turning from your back to your side while in a flat bed without using bedrails?: None Help needed moving from lying on your back to sitting on the side of a flat bed without using bedrails?: None Help needed moving to and from a bed to a chair (including a wheelchair)?: A Little Help needed standing up from a chair using your arms (e.g., wheelchair or bedside chair)?: A Little Help needed to walk in hospital room?: A Little Help  needed climbing 3-5 steps with a railing? : A Lot 6 Click Score: 19    End of Session   Activity Tolerance: Patient limited by fatigue Patient left: in bed;with call bell/phone within reach;with family/visitor present Nurse Communication: Mobility status PT Visit Diagnosis: Other abnormalities of gait and mobility (R26.89);Unsteadiness on feet (R26.81)     Time: 6045-4098 PT Time Calculation (min) (ACUTE ONLY): 29 min  Charges:    $Gait Training: 8-22 mins $Therapeutic Activity: 8-22 mins PT General Charges $$ ACUTE PT VISIT: 1 Visit                     Arlyss Gandy, PT, DPT Acute Rehabilitation Office [phone]    Arlyss Gandy 09/11/2022, 1:21 PM

## 2022-09-11 NOTE — Inpatient Diabetes Management (Signed)
Inpatient Diabetes Program Recommendations  AACE/ADA: New Consensus Statement on Inpatient Glycemic Control (2015)  Target Ranges:  Prepandial:   less than 140 mg/dL      Peak postprandial:   less than 180 mg/dL (1-2 hours)      Critically ill patients:  140 - 180 mg/dL   Lab Results  Component Value Date   GLUCAP 159 (H) 09/11/2022   HGBA1C 11.7 (H) 09/09/2022    Review of Glycemic Control  Diabetes history: DM2 Outpatient Diabetes medications: None Current orders for Inpatient glycemic control: Semglee 14 at bedtime, Novolog 0-15 TID with meals and 0-5 HS  HgbA1C - 11.7% CBGs 228, 159  Inpatient Diabetes Program Recommendations:    Will need meal coverage insulin when pt is eating > 50%.  Spoke with pt and daughter-in-law at bedside. Answered questions from DIL regarding insulin titration. DIL concerned that pt is unable to eat d/t nausea.  Will continue to watch glucose trends. .  Thank you. Ailene Ards, RD, LDN, CDCES Inpatient Diabetes Coordinator [phone]

## 2022-09-11 NOTE — Plan of Care (Signed)

## 2022-09-12 ENCOUNTER — Other Ambulatory Visit (HOSPITAL_COMMUNITY): Payer: Self-pay

## 2022-09-12 DIAGNOSIS — R110 Nausea: Secondary | ICD-10-CM | POA: Diagnosis not present

## 2022-09-12 DIAGNOSIS — R1013 Epigastric pain: Secondary | ICD-10-CM | POA: Diagnosis not present

## 2022-09-12 DIAGNOSIS — R42 Dizziness and giddiness: Secondary | ICD-10-CM | POA: Diagnosis not present

## 2022-09-12 LAB — COMPREHENSIVE METABOLIC PANEL
ALT: 117 U/L — ABNORMAL HIGH (ref 0–44)
AST: 47 U/L — ABNORMAL HIGH (ref 15–41)
Albumin: 2.9 g/dL — ABNORMAL LOW (ref 3.5–5.0)
Alkaline Phosphatase: 106 U/L (ref 38–126)
Anion gap: 10 (ref 5–15)
BUN: 10 mg/dL (ref 8–23)
CO2: 21 mmol/L — ABNORMAL LOW (ref 22–32)
Calcium: 8.6 mg/dL — ABNORMAL LOW (ref 8.9–10.3)
Chloride: 103 mmol/L (ref 98–111)
Creatinine, Ser: 0.65 mg/dL (ref 0.44–1.00)
GFR, Estimated: >60 mL/min (ref >60)
Glucose, Bld: 150 mg/dL — ABNORMAL HIGH (ref 70–99)
Potassium: 4.2 mmol/L (ref 3.5–5.1)
Sodium: 134 mmol/L — ABNORMAL LOW (ref 135–145)
Total Bilirubin: 1.5 mg/dL — ABNORMAL HIGH (ref 0.3–1.2)
Total Protein: 5.9 g/dL — ABNORMAL LOW (ref 6.5–8.1)

## 2022-09-12 LAB — CBC
HCT: 38.9 % (ref 36.0–46.0)
Hemoglobin: 13.1 g/dL (ref 12.0–15.0)
MCH: 32.2 pg (ref 26.0–34.0)
MCHC: 33.7 g/dL (ref 30.0–36.0)
MCV: 95.6 fL (ref 80.0–100.0)
Platelets: 251 10*3/uL (ref 150–400)
RBC: 4.07 MIL/uL (ref 3.87–5.11)
RDW: 12.7 % (ref 11.5–15.5)
WBC: 8.5 10*3/uL (ref 4.0–10.5)
nRBC: 0.2 % (ref 0.0–0.2)

## 2022-09-12 LAB — GLUCOSE, CAPILLARY
Glucose-Capillary: 127 mg/dL — ABNORMAL HIGH (ref 70–99)
Glucose-Capillary: 160 mg/dL — ABNORMAL HIGH (ref 70–99)
Glucose-Capillary: 152 mg/dL — ABNORMAL HIGH (ref 70–99)
Glucose-Capillary: 171 mg/dL — ABNORMAL HIGH (ref 70–99)

## 2022-09-12 MED ORDER — LACTATED RINGERS IV SOLN: INTRAVENOUS | Status: AC

## 2022-09-12 NOTE — Progress Notes (Signed)
Occupational Therapy Treatment/Discharge Patient Details Name: Robin Tucker MRN: [id number] DOB: 08/09/1950 Today's Date: 09/12/2022   History of present illness Pt is a 72 y.o. female presenting 09/08/2022 with epigastric pain, nausea, an vomiting. Labs notable for  slight elevation white count at 11, lipase 57, glucose over 400, AST 396, ALT 185, alk phos 125, total bili 2.4, Lactate 2.6. Found to have questionable gallbladder thickening. HIDA scan 8/18 normal. PMH significant for untreated DMII   OT comments  Pt in bed upon therapy arrival with continued reports of nausea although slightly less than it had been. Session focused on providing pt education regarding discharge plan and assist that is available at home. Pt reports that her sister will be able to stay with her for a little bit. Pt's bedroom and full bath are on the main level of the home. Kitchen is on 2nd level. Recommended using shower seat when bathing. Provided handout on energy conservation strategies and discussed slowly increasing activity tolerance and endurance over time. Pt utilized RW to navigate to/from bathroom and bed without difficulty. Completed toileting, grooming, and LB dressing without assistance. At this time, all therapy goals have been met. No further acute OT needs identified. OT will sign off.       If plan is discharge home, recommend the following:  A little help with walking and/or transfers;A little help with bathing/dressing/bathroom;Assistance with cooking/housework;Help with stairs or ramp for entrance;Assist for transportation   Equipment Recommendations  Tub/shower seat       Precautions / Restrictions Precautions Precautions: Fall;Other (comment) Precaution Comments: Air/Con Prec for possible Covid (pt declining screening) Restrictions Weight Bearing Restrictions: No       Mobility Bed Mobility Overal bed mobility: Modified Independent   Transfers Overall transfer level: Needs  assistance Equipment used: Rolling walker (2 wheels) Transfers: Sit to/from Stand, Bed to chair/wheelchair/BSC Sit to Stand: Supervision     Step pivot transfers: Supervision     General transfer comment: No physical assist required during transfer. OT assisted with IV pole management only. Pt demonstrated appropriate hand placement with RW management.     Balance Overall balance assessment: No apparent balance deficits (not formally assessed)         ADL either performed or assessed with clinical judgement   ADL       Grooming: Modified independent;Standing;Wash/dry hands    Lower Body Dressing: Supervision/safety;Sitting/lateral leans (pt donned socks while seated EOB)   Toilet Transfer: Supervision/safety;Rolling walker (2 wheels)   Toileting- Clothing Manipulation and Hygiene: Modified independent;Sit to/from stand;Sitting/lateral lean       Functional mobility during ADLs: Supervision/safety;Rolling walker (2 wheels)        Cognition Arousal: Lethargic Behavior During Therapy: Flat affect Overall Cognitive Status: Within Functional Limits for tasks assessed      General Comments: Able to follow commads. Listened and verbalized understanding of education provided during session.              General Comments Pt educated on energy conservation strategies such as using shower seat when bathing at home, having someone stay with her initially when discharged to provide assist, gradually increasing her activity tolerance and endurance level over time. Provided handout for reference.    Pertinent Vitals/ Pain       Pain Assessment Pain Assessment: Faces Faces Pain Scale: Hurts a little bit Pain Location: right shoulder Pain Descriptors / Indicators: Sore Pain Intervention(s): Monitored during session, Other (comment) (suggested hot pack - pt declined)  Frequency  Min 1X/week        Progress Toward Goals  OT Goals(current goals can now be  found in the care plan section)  Progress towards OT goals: Goals met/education completed, patient discharged from OT  ADL Goals Pt Will Perform Grooming:  (goal met 8/21) Pt Will Perform Lower Body Dressing:  (goal met 8/21) Pt Will Transfer to Toilet:  (goal met 8/21) Additional ADL Goal #1:  (goal met 8/21) Additional ADL Goal #2:  (goal met 8/21)         AM-PAC OT "6 Clicks" Daily Activity     Outcome Measure   Help from another person eating meals?: None Help from another person taking care of personal grooming?: None Help from another person toileting, which includes using toliet, bedpan, or urinal?: None Help from another person bathing (including washing, rinsing, drying)?: A Little Help from another person to put on and taking off regular upper body clothing?: None Help from another person to put on and taking off regular lower body clothing?: None 6 Click Score: 23    End of Session Equipment Utilized During Treatment: Rolling walker (2 wheels)  OT Visit Diagnosis: Unsteadiness on feet (R26.81);Muscle weakness (generalized) (M62.81);BPPV   Activity Tolerance Patient limited by fatigue   Patient Left in bed;with call bell/phone within reach;with family/visitor present           Time: 8295-6213 OT Time Calculation (min): 21 min  Charges: OT General Charges $OT Visit: 1 Visit OT Treatments $Self Care/Home Management : 8-22 mins  Limmie Patricia, OTR/L,CBIS  Supplemental OT - [facility] and [facility] Secure Chat Preferred    Caria Transue, Charisse March 09/12/2022, 4:13 PM

## 2022-09-12 NOTE — TOC Benefit Eligibility Note (Signed)
Patient Product/process development scientist completed.    The patient is insured through [facility]. Patient has Medicare and is not eligible for a copay card, but may be able to apply for patient assistance, if available.    Ran test claim for Lantus Pen and the current 30 day co-pay is $35.00.  Ran test claim for Humalog KwikPen and the current 30 day co-pay is $35.00.   This test claim was processed through [facility]- copay amounts may vary at other pharmacies due to pharmacy/plan contracts, or as the patient moves through the different stages of their insurance plan.     Roland Earl, CPHT Pharmacy Technician III Certified Patient Advocate [facility] Patient Advocate Team Direct Number: [phone]  Fax: [phone]

## 2022-09-12 NOTE — Progress Notes (Signed)
Subjective:   Robin Tucker is a 72 year old female who presented with sudden onset epigastric pain (acalculous cholecystitis vs choledocholithiasis, resolved) and previously undiagnosed type 2 diabetes (A1c 11.7%) who now presents with 2 days of worsening nausea and vomiting since being in the hospital.   No acute events overnight.  No medication refusals.  PRNs: Compazine 5 mg x1.  Received 8 units of insulin aspart yesterday on moderate sliding scale.  On interview with sister in room, patient is resting comfortably in bed.  Patient reports improvement in nausea, epigastric pain (now with severity of 1-3).  Muscle aches about the same.  Patient slept well.  Continues to have poor p.o. intake-about half of a graham cracker.  Diabetes coordinator yesterday saw yesterday.  Did not eat dinner as she did not like the quality of the food.  Does not feel particularly hungry at the moment.  Will attempt to increase p.o. intake.  Amenable to IV fluids today.  Objective:  Blood pressure: 143/62.  Heart rate: 74.  Temperature: 99.6 (94.8).  Satting 95% on room air.  Vital signs in last 24 hours: Vitals:   09/11/22 1701 09/11/22 2130 09/12/22 0347 09/12/22 0900  BP: (!) 143/70 (!) 148/57 (!) 143/62 (!) 158/76  Pulse: 73 73 74 78  Resp: 17 18 18 18   Temp: 98.3 F (36.8 C) 98.4 F (36.9 C) 99.6 F (37.6 C) 98.2 F (36.8 C)  TempSrc: Oral Oral Oral Oral  SpO2: 96% 94% 95% 96%  Weight:      Height:       Constitutional: Well appearing, well-nourished Cardiovascular: Rate and rhythm no murmurs rubs or gallops Respiratory: Clear to auscultation bilaterally Abdominal: No tenderness to palpation in 4 quadrants, bowel sounds present MSK: Grossly normal Neuro: Grossly normal Psych: Appropriate mood and affect  Pertinent Labs: Glucose: 155 (159-228 over 24 hours) Blood cultures negative AST: 47 (79) ALT: 117 (163) Bilirubin: 1.5 (1.7) Potassium: 4.2 (3.3)  Pertinent imaging: No new  imaging.  Assessment/Plan:  Principal Problem:   Epigastric pain Active Problems:   Morbid obesity (HCC)   Type 2 diabetes mellitus (HCC)  #Nausea, resolving #Vertigo #Hypokalemia, resolved Transition from IV ceftriaxone/metronidazole to Augmentin 8/20 in case IV antibiotics were causing nausea.  Patient required Compazine 5 mg x1 for nausea overnight.  Vestibular evaluation performed yesterday 8/20, limited by nausea.  Was unable to complete OT yesterday.  EKG yesterday showed QTc of 446.  On interview, patient said that her nausea was improving after discontinuing IV metronidazole.  Will attempt greater p.o. intake later in afternoon.  We will likely keep another today and continue to monitor. - Begin IV 1.5 L of lactated Ringer's, 100 mL/h for 15 hours 8/21. - Continue scheduled IV metoclopramide 5 mg every 6 hours for nausea and vomiting.  Will consider reducing this medication over course of 8/21 if p.o. intake improves. - Continue TIGAN 200mg  every 6 hours as needed for nausea and vomiting. - Continue Compazine 5 mg every 4 hours as needed for nausea and vomiting. - PT vestibular eval limited by patient nausea: Likeliest diagnosis vestibular hypofunction.  Recommend follow-up outpatient. - CMP order in, will assess potassium status.   #Type II DM  Glucoses ranging from 159-228 over the last 24 hours.  Increased from 10 units insulin glargine to 14 units insulin glargine last night.  Glucoses downtrending appropriately.  Patient received 8 units insulin aspart on MSSI yesterday.  Held the dinnertime due to poor p.o. intake.  Diabetes management saw yesterday,  signing off. - Continue insulin glargine to 14 units nightly. - Continue moderate sliding scale insulin regimen while in hospital, will likely discharge on NovoLog 4 units 3 times daily with meals. - We will defer adding metformin to her discharge regimen at this time given her persistent nausea/GI symptoms. - Scheduled outpatient  follow-up with internal medicine center with Annett Fabian, MD on September 5, 8:45 AM.   #Suspicion for choledocholithiasis #Elevated LFTs #Hyperbilirubinemia  #Epigastric pain #Body aches Epigastric pain similar/less intense.  Muscle aches about the same as well.  LFTs continue to improve.  Temperature slightly elevated to 99.6 Fahrenheit after antibiotics changes yesterday.  White blood cell count 8.5 within normal limits.  Blood cultures on admit negative.  We will continue to monitor. Has not received PRN pain medication since 8/19.  Plan is to continue PO augmentin to complete 7-day antibiotic course (8/20-8/27).  - Continue Augmentin 875-125 twice daily (8/20-8/24). - Patient received 3 doses of IV ceftriaxone 2 g daily/metronidazole 500 mg twice daily (8/17-8/20). - Continue as needed Dilaudid 0.5 mg every 6 hours for severe pain. - Continue acetaminophen 650 mg every 6 hours as needed for mild pain.   #Concern for suicidal ideation  Deferred conversation about outpatient psychotherapy/medication management as family was in room. Will discuss this with outpatient follow-up before discharge likely tomorrow.  Prior to Admission Living Arrangement: Home, with self Anticipated Discharge Location: Home Barriers to Discharge: Nausea Dispo: Anticipated discharge in approximately 1 day.   Tomie China, MD 09/12/2022, 10:23 AM Pager: [phone] After 5pm on weekdays and 1pm on weekends: On Call pager [phone]

## 2022-09-13 ENCOUNTER — Other Ambulatory Visit (HOSPITAL_COMMUNITY): Payer: Self-pay

## 2022-09-13 DIAGNOSIS — R1013 Epigastric pain: Secondary | ICD-10-CM | POA: Diagnosis not present

## 2022-09-13 LAB — CULTURE, BLOOD (ROUTINE X 2)
Culture: NO GROWTH
Culture: NO GROWTH
Special Requests: ADEQUATE

## 2022-09-13 LAB — COMPREHENSIVE METABOLIC PANEL
ALT: 101 U/L — ABNORMAL HIGH (ref 0–44)
AST: 55 U/L — ABNORMAL HIGH (ref 15–41)
Albumin: 2.9 g/dL — ABNORMAL LOW (ref 3.5–5.0)
Alkaline Phosphatase: 105 U/L (ref 38–126)
Anion gap: 8 (ref 5–15)
BUN: 7 mg/dL — ABNORMAL LOW (ref 8–23)
CO2: 24 mmol/L (ref 22–32)
Calcium: 8.8 mg/dL — ABNORMAL LOW (ref 8.9–10.3)
Chloride: 106 mmol/L (ref 98–111)
Creatinine, Ser: 0.78 mg/dL (ref 0.44–1.00)
GFR, Estimated: >60 mL/min (ref >60)
Glucose, Bld: 183 mg/dL — ABNORMAL HIGH (ref 70–99)
Potassium: 3.6 mmol/L (ref 3.5–5.1)
Sodium: 138 mmol/L (ref 135–145)
Total Bilirubin: 1.2 mg/dL (ref 0.3–1.2)
Total Protein: 5.8 g/dL — ABNORMAL LOW (ref 6.5–8.1)

## 2022-09-13 LAB — GLUCOSE, CAPILLARY
Glucose-Capillary: 133 mg/dL — ABNORMAL HIGH (ref 70–99)
Glucose-Capillary: 187 mg/dL — ABNORMAL HIGH (ref 70–99)

## 2022-09-13 MED ORDER — LOSARTAN POTASSIUM 25 MG PO TABS
25.0000 mg | ORAL_TABLET | Freq: Every day | ORAL | 0 refills | Status: DC
Start: 2022-09-13 — End: 2022-10-16
  Filled 2022-09-13: qty 30, 30d supply, fill #0

## 2022-09-13 MED ORDER — ACCU-CHEK SOFTCLIX LANCETS MISC
1.0000 | Freq: Three times a day (TID) | 0 refills | Status: DC
  Filled 2022-09-13: qty 100, 30d supply, fill #0

## 2022-09-13 MED ORDER — AMOXICILLIN-POT CLAVULANATE 875-125 MG PO TABS
1.0000 | ORAL_TABLET | Freq: Two times a day (BID) | ORAL | 0 refills | Status: AC
Start: 2022-09-13 — End: 2022-09-16
  Filled 2022-09-13: qty 5, 3d supply, fill #0

## 2022-09-13 MED ORDER — PROCHLORPERAZINE MALEATE 5 MG PO TABS
5.0000 mg | ORAL_TABLET | Freq: Three times a day (TID) | ORAL | 0 refills | Status: DC | PRN
  Filled 2022-09-13: qty 10, 4d supply, fill #0

## 2022-09-13 MED ORDER — LOSARTAN POTASSIUM 25 MG PO TABS
25.0000 mg | ORAL_TABLET | Freq: Once | ORAL | Status: AC
  Administered 2022-09-13: 25 mg via ORAL
  Filled 2022-09-13: qty 1

## 2022-09-13 MED ORDER — INSULIN LISPRO (1 UNIT DIAL) 100 UNIT/ML (KWIKPEN)
4.0000 [IU] | PEN_INJECTOR | Freq: Three times a day (TID) | SUBCUTANEOUS | 0 refills | Status: DC
  Filled 2022-09-13: qty 3, 25d supply, fill #0

## 2022-09-13 MED ORDER — INSULIN GLARGINE 100 UNIT/ML SOLOSTAR PEN
14.0000 [IU] | PEN_INJECTOR | Freq: Every day | SUBCUTANEOUS | 0 refills | Status: DC
  Filled 2022-09-13: qty 6, 42d supply, fill #0

## 2022-09-13 MED ORDER — BLOOD GLUCOSE TEST VI STRP
1.0000 | ORAL_STRIP | Freq: Three times a day (TID) | 0 refills | Status: DC
  Filled 2022-09-13: qty 100, 30d supply, fill #0

## 2022-09-13 MED ORDER — LANCET DEVICE MISC
1.0000 | Freq: Three times a day (TID) | 0 refills | Status: AC
  Filled 2022-09-13: qty 1, 30d supply, fill #0

## 2022-09-13 MED ORDER — INSULIN PEN NEEDLE 32G X 4 MM MISC
1.0000 | Freq: Four times a day (QID) | 0 refills | Status: DC
  Filled 2022-09-13: qty 100, 25d supply, fill #0

## 2022-09-13 MED ORDER — BLOOD GLUCOSE MONITOR SYSTEM W/DEVICE KIT
1.0000 | PACK | Freq: Three times a day (TID) | 0 refills | Status: AC
  Filled 2022-09-13: qty 1, 30d supply, fill #0

## 2022-09-13 NOTE — Discharge Summary (Addendum)
Name: Robin Tucker MRN: [id number] DOB: 1950-06-29 72 y.o. PCP: Shirline Frees, NP  Date of Admission: 09/08/2022  7:16 AM Date of Discharge: 09/13/2022 Attending Physician: Dr. Heide Spark  Discharge Diagnosis: Principal Problem:   Epigastric pain Active Problems:   Morbid obesity (HCC)   Type 2 diabetes mellitus (HCC)    Discharge Medications: Allergies as of 09/13/2022       Reactions   Codeine Hives        Medication List     STOP taking these medications    ASPIRIN 81 PO   diazepam 5 MG tablet Commonly known as: VALIUM   ibuprofen 200 MG tablet Commonly known as: ADVIL       TAKE these medications    Accu-Chek Softclix Lancets lancets Use 1 each in the morning, at noon, and at bedtime.   amoxicillin-clavulanate 875-125 MG tablet Commonly known as: AUGMENTIN Take 1 tablet by mouth every 12 (twelve) hours for 5 doses.   Blood Glucose Monitor System w/Device Kit Use in the morning, at noon, and at bedtime.   BLOOD GLUCOSE TEST STRIPS Strp USe in the morning, at noon, and at bedtime. May substitute to any manufacturer covered by patient's insurance.   insulin glargine 100 UNIT/ML Solostar Pen Commonly known as: LANTUS Inject 14 Units into the skin daily.   insulin lispro 100 UNIT/ML KwikPen Commonly known as: HUMALOG Inject 4 Units into the skin 3 (three) times daily.   Insulin Pen Needle 32G X 4 MM Misc Use in the morning, at noon, in the evening, and at bedtime. Change your pen needles with each use   Lancet Device Misc 1 each by Does not apply route in the morning, at noon, and at bedtime. May substitute to any manufacturer covered by patient's insurance.   losartan 25 MG tablet Commonly known as: COZAAR Take 1 tablet (25 mg total) by mouth daily.   prochlorperazine 5 MG tablet Commonly known as: COMPAZINE Take 1 tablet (5 mg total) by mouth every 8 (eight) hours as needed for up to 10 doses for nausea or vomiting.         Disposition and follow-up:   Robin Tucker was discharged from [facility] in Good condition.  At the hospital follow up visit please address:  1.  Follow-up:  a. Epigastric pain (choledocholithiasis vs acalculous cholecystitis): AST: 396, ALT 185, Bili 2.4. on admission, was returning to normal on discharge.  Managed with IV Dilaudid 0.5 as needed, later managed entirely with PRN tylenol at end of stay. Described as an "ache". CT & HIDA scan negative for biliary obstruction.  Finished 5-day course of empiric antibiotics (Rocephin/Flagyl 8/17-8/20, Augmentin 8/20-8/22).  Discharged on 2 further days of Augmentin to be finished on 8/24 for a total of 7 days.    b. Nausea/vomiting/unsteadiness on feet: likely related to IV metronidazole, resolved after discontinuation on scheduled reglan and PRN compazine. Discharged on 10 tablets of PO Compazine 5mg . On walker at home. OT cleared before d/c. Could refer to mobility specialist.   c. Vestibular hypofunction vs BPPV: patient has a diagnosis of vertigo from 2016. Vestibular testing over hospital stay was inconclusive as patient was acutely nauseous at time of testing. Differential BPPV vs vestibular hypofunction. Needed PCP visit before referral. Please send referral.    d. Type II Diabetes: Diagnosed during this hospital stay. A1c 11.7%. Discharged on 14u glargine at bedtime and 4u lispro TID. Sugars ~120s-170s. Education provided. Metformin deferred in setting of GI symptoms.  e. Stress: patient reported feeling stressed at home and asked for options for outpatient follow-up. Sister reported chronic SI, without plan. Patient has strong family support, sister will be living with her in wake of hospital discharge. Psych consults recommended outpatient therapy, med management.  Please address this in outpatient setting. Strongly recommend psych referral at that time.    f. HTN: Systolic BPs 140s-150s throughout stay, peaked at  173/75 on day of discharge. Prescribed losartan 25mg . Follow up kidney function labs. Titrate as needed.   2.  Labs / imaging needed at time of follow-up: CMP for BUN/Cr, LFTs, potassium.  3.  Pending labs/ test needing follow-up: none  4.  Medication Changes  Abx - Augmentin 875-125 BID x5, Insulin lispro 4u TID, insulin glargine 14u qhs, losartan 25mg , procloperazine 5mg  q8h as needed x10, BG monitoring system, lancets/test strips End Date: 09/15/2022  Follow-up Appointments:  Follow-up Information     Annett Fabian, MD Follow up.   Specialty: Internal Medicine Why: September 27, 2022 at 0845 am Contact information: [address] [place] [address] [phone]                 Hospital Course by problem list:  #Epigastric pain: choledocholithiasis vs acalculous cholecystitis  #Elevated LFTs #Hyperbilirubinemia #Hepatic steatosis Patient presented with acute epigastric pain, (described as an "ache") that came on all at once in the early morning of 8/17 as well as nausea/vomiting in the setting of a glucose of 400, positive urine ketones, elevated LFTs/bilirubin. Lipase 57, negative for pancreatitis. Afebrile throughout. CT abd pelvis showed hepatic steatosis, but negative for obstruction. RUQ US showed no gallstones or gallbladder sludge. Equivocal gallbladder wall thickening. Positive sonographic Murphy's sign. HIDA scan showed no signs of biliary obstruction, without evidence for acute cholecystitis. Likeliest etiology: choledocholithiasis (with passed stone) vs acalculous cholecystitis. Gen Surg and GI consulted, agreed with no surgical indication, 1 week of empiric antibiotics and conservative management. Pain resolved with PRN dilaudid 0.5 mg early in stay -- patient required only PRN tylenol on discharge. Per GI recs, we have started and continued Rocephin and Flagyl for three days (8/17-8/20) after which we began a four day course of augmentin 875-125mg  BID (8/20-8/24)  for a total of seven days of antibiotics. The patient should be on antibiotics until the evening of 8/24. Medications were given at discharge.  #Type 2 diabetes mellitus, new, uncontrolled Patient presented with a blood glucose >400 and A1c 11.7 and diagnosed with T2DM during this hospital stay. This was uncontrolled previously. Patient was started on 10 units of insulin at bedtime with moderate sliding scale insulin, glargine increased to 14 units on 8/20. Patient was started on carb modified diet with close CBGs. She was seen by diabetes education and shown proper insulin administration. Glucoses ranged from 127-171 on discharge. Discharged on insulin glargine 14u and insulin aspart 4u TID with meals, as well as glucose monitor + strips + needles.   #HTN Patient SBPs 140-150 throughout stay. Last BP 173/75. Will discharge on Losartan 25mg  qday for hypertension and renal protection in T2DM. Can titrate as needed in outpatient setting.  #Nausea, likely iatrogenic 2/2 IV flagyl #Vertigo, chronic Patient was complaining of nausea and vertigo like symptoms since admission, which worsened acutely on 8/20. Patient was also notably less steady at that time and did not feel safe to go home from the hospital. Unlikely due to hypoglycemia, as patient blood glucoses are ranging between 100s/200s. We ordered PT vestibular therapy, who suggested vestibular hypofunction>BPPV as  preliminary diagnosis (patient has previous vertigo dx from 2016.) Patient improved rapidly after we discontinued IV Ceftriaxone/Flagyl and began Augmentin. Symptoms likely 2/2 Flagyl, for which nausea/vomiting is a known adverse effect. Patient was started on IV Compazine 5 mg every 4 hours as needed for nausea and vomiting as well as IM TIGAN 200mg  q6H PRN for nausea and vomiting. Repeat EKG 8/20 showed Qtc of 446 after which we began scheduled Reglan. Also gave protonix 40 mg x1 for upper GI disturbances. Will discharge on 10 tablets of PO  compazine 5mg  q8h as needed.     #Concern for depressed mood Per GI note, patient reported stressors in life that have been weighing on her. She states that she would like ideas on how to cope with this better. Psych consulted and recommended outpatient individual therapy. She declined a chaplain consult when offered.  Very low concern for SI at this time. Patient has strong family support as well as religious beliefs and will have sister living with her for some time after discharge. Will instruct to follow-up this in outpatient clinic.  #Hypokalemia, resolved Patient was found to be hypokalemic 8/20 to 3.3 after administration of IV fluids. Repleted successfully 8/21 to 4.2.  #Leukocytosis, resolved Patient presented with WBC of 11.1, likely due to the acute process above. Currently resolved.   Discharge Subjective:  On interview, patient feeling much better today.  Manage get down some of her breakfast (grits).  Body aches have resolved.  No vomiting of the past 24 hours.  No change in epigastric ache. was able to walk to the door and her bathroom, and feels confident that she can walk further today.  Feels okay with discharge.  Patient noted bilateral frontal temporal headache.  No vision changes/blurriness.  Patient amenable to beginning losartan.  Informed patient to follow up on September 5, at which point these issues will be reevaluated.  Team detailed possible etiology of epigastric pain.  Patient has no other concerns.  Discharge Exam:   BP (!) 173/75 (BP Location: Left Arm)   Pulse 85   Temp 98.5 F (36.9 C) (Oral)   Resp 16   Ht 5\' 4"  (1.626 m)   Wt 207 lb (93.9 kg)   SpO2 100%   BMI 35.53 kg/m  Constitutional: well-appearing, sitting in bed, in no acute distress HENT: normocephalic atraumatic, mucous membranes moist Eyes: conjunctiva non-erythematous Neck: supple Cardiovascular: regular rate and rhythm, no m/r/g Pulmonary/Chest: normal work of breathing on room air, lungs  clear to auscultation bilaterally Abdominal: soft, non-tender, non-distended MSK: normal bulk and tone Neurological: alert & oriented x 3, 5/5 strength in bilateral upper and lower extremities, normal gait Skin: warm and dry Psych: Appropriate mood and affect.    Pertinent Labs, Studies, and Procedures:     Latest Ref Rng & Units 09/12/2022    6:48 AM 09/11/2022   12:09 AM 09/10/2022    7:36 AM  CBC  WBC 4.0 - 10.5 K/uL 8.5  7.0  7.2   Hemoglobin 12.0 - 15.0 g/dL 78.2  95.6  21.3   Hematocrit 36.0 - 46.0 % 38.9  37.4  38.2   Platelets 150 - 400 K/uL 251  212  200        Latest Ref Rng & Units 09/12/2022    6:48 AM 09/11/2022   12:09 AM 09/10/2022    7:36 AM  CMP  Glucose 70 - 99 mg/dL 086  578  469   BUN 8 - 23 mg/dL 10  11  12   Creatinine 0.44 - 1.00 mg/dL 1.61  0.96  0.45   Sodium 135 - 145 mmol/L 134  135  134   Potassium 3.5 - 5.1 mmol/L 4.2  3.3  3.6   Chloride 98 - 111 mmol/L 103  103  103   CO2 22 - 32 mmol/L 21  22  21    Calcium 8.9 - 10.3 mg/dL 8.6  8.9  8.3   Total Protein 6.5 - 8.1 g/dL 5.9  5.6  5.6   Total Bilirubin 0.3 - 1.2 mg/dL 1.5  1.7  2.5   Alkaline Phos 38 - 126 U/L 106  114  111   AST 15 - 41 U/L 47  79  123   ALT 0 - 44 U/L 117  163  196     NM Hepatobiliary Liver Func  Result Date: 09/09/2022 CLINICAL DATA:  Evaluate for cholecystitis. EXAM: NUCLEAR MEDICINE HEPATOBILIARY IMAGING TECHNIQUE: Sequential images of the abdomen were obtained out to 60 minutes following intravenous administration of radiopharmaceutical. RADIOPHARMACEUTICALS:  7.6 mCi Tc-18m  Choletec IV COMPARISON:  Right upper quadrant sonogram from 09/08/2022 FINDINGS: Prompt uptake and biliary excretion of activity by the liver is seen. Near the end of the first hour of imaging the gallbladder activity is visualized, consistent with patency of cystic duct. Subsequently, biliary activity passes into small bowel, consistent with patent common bile duct. IMPRESSION: 1. Patent cystic duct  without evidence for acute cholecystitis. 2. No signs of biliary obstruction. Electronically Signed   By: Signa Kell M.D.   On: 09/09/2022 11:50   US Abdomen Limited RUQ (LIVER/GB)  Result Date: 09/08/2022 CLINICAL DATA:  Elevated LFTs and epigastric pain. EXAM: ULTRASOUND ABDOMEN LIMITED RIGHT UPPER QUADRANT COMPARISON:  CT from earlier today FINDINGS: Gallbladder: No gallstones, gallbladder sludge, or pericholecystic fluid. Equivocal wall thickening measuring between 2 and 3 mm. Positive sonographic Murphy's sign reported. Common bile duct: Diameter: 3.3 mm Liver: Did no focal lesion identified. Increased parenchymal echogenicity of the liver suggesting hepatic steatosis. Portal vein is patent on color Doppler imaging with normal direction of blood flow towards the liver. Other: None. IMPRESSION: 1. No gallstones or gallbladder sludge. Equivocal gallbladder wall thickening. Positive sonographic Murphy's sign reported. Imaging findings are nonspecific. If there is a high clinical concern for acute cholecystitis consider further investigation with nuclear medicine hepatic biliary scan. 2. Hepatic steatosis. Electronically Signed   By: Signa Kell M.D.   On: 09/08/2022 10:31   CT Angio Chest PE W and/or Wo Contrast  Result Date: 09/08/2022 CLINICAL DATA:  Epigastric pain, concern for pulmonary embolism. EXAM: CT ANGIOGRAPHY CHEST CT ABDOMEN AND PELVIS WITH CONTRAST TECHNIQUE: Multidetector CT imaging of the chest was performed using the standard protocol during bolus administration of intravenous contrast. Multiplanar CT image reconstructions and MIPs were obtained to evaluate the vascular anatomy. Multidetector CT imaging of the abdomen and pelvis was performed using the standard protocol during bolus administration of intravenous contrast. RADIATION DOSE REDUCTION: This exam was performed according to the departmental dose-optimization program which includes automated exposure control, adjustment of  the mA and/or kV according to patient size and/or use of iterative reconstruction technique. CONTRAST:  75mL OMNIPAQUE IOHEXOL 350 MG/ML SOLN COMPARISON:  None Available. FINDINGS: CTA CHEST FINDINGS Cardiovascular: Satisfactory opacification of the pulmonary arteries to the segmental level. No evidence of pulmonary embolism. The heart is mildly enlarged. No pericardial effusion. Mediastinum/Nodes: No enlarged mediastinal, hilar, or axillary lymph nodes. Thyroid gland, trachea, and esophagus demonstrate no significant findings. Lungs/Pleura: There  is mild-to-moderate bilateral atelectasis. No pleural effusion or pneumothorax. Musculoskeletal: Degenerative changes are seen in the spine. Review of the MIP images confirms the above findings. CT ABDOMEN and PELVIS FINDINGS Hepatobiliary: The liver is diffusely hypoattenuating, consistent with hepatic steatosis. No focal liver abnormality is seen. No gallstones, gallbladder wall thickening, or biliary dilatation. Pancreas: Unremarkable. No pancreatic ductal dilatation or surrounding inflammatory changes. Spleen: Normal in size without focal abnormality. Adrenals/Urinary Tract: Adrenal glands are unremarkable. Kidneys are normal, without renal calculi, focal lesion, or hydronephrosis. Bladder is unremarkable. Stomach/Bowel: Stomach is within normal limits. Appendix appears normal. No evidence of bowel wall thickening, distention, or inflammatory changes. Vascular/Lymphatic: Aortic atherosclerosis. No enlarged abdominal or pelvic lymph nodes. Reproductive: A uterine mass measuring 3.8 cm likely represents a fibroid. No adnexal mass. Other: No abdominal wall hernia or abnormality. No abdominopelvic ascites. Musculoskeletal: Degenerative changes are seen in the spine. Review of the MIP images confirms the above findings. IMPRESSION: 1. No evidence of pulmonary embolism. 2. No acute findings in the chest, abdomen or pelvis. 3. Hepatic steatosis. 4. Uterine mass likely  represents a fibroid. Aortic Atherosclerosis (ICD10-I70.0). Electronically Signed   By: Romona Curls M.D.   On: 09/08/2022 09:27   CT ABDOMEN PELVIS W CONTRAST  Result Date: 09/08/2022 CLINICAL DATA:  Epigastric pain, concern for pulmonary embolism. EXAM: CT ANGIOGRAPHY CHEST CT ABDOMEN AND PELVIS WITH CONTRAST TECHNIQUE: Multidetector CT imaging of the chest was performed using the standard protocol during bolus administration of intravenous contrast. Multiplanar CT image reconstructions and MIPs were obtained to evaluate the vascular anatomy. Multidetector CT imaging of the abdomen and pelvis was performed using the standard protocol during bolus administration of intravenous contrast. RADIATION DOSE REDUCTION: This exam was performed according to the departmental dose-optimization program which includes automated exposure control, adjustment of the mA and/or kV according to patient size and/or use of iterative reconstruction technique. CONTRAST:  75mL OMNIPAQUE IOHEXOL 350 MG/ML SOLN COMPARISON:  None Available. FINDINGS: CTA CHEST FINDINGS Cardiovascular: Satisfactory opacification of the pulmonary arteries to the segmental level. No evidence of pulmonary embolism. The heart is mildly enlarged. No pericardial effusion. Mediastinum/Nodes: No enlarged mediastinal, hilar, or axillary lymph nodes. Thyroid gland, trachea, and esophagus demonstrate no significant findings. Lungs/Pleura: There is mild-to-moderate bilateral atelectasis. No pleural effusion or pneumothorax. Musculoskeletal: Degenerative changes are seen in the spine. Review of the MIP images confirms the above findings. CT ABDOMEN and PELVIS FINDINGS Hepatobiliary: The liver is diffusely hypoattenuating, consistent with hepatic steatosis. No focal liver abnormality is seen. No gallstones, gallbladder wall thickening, or biliary dilatation. Pancreas: Unremarkable. No pancreatic ductal dilatation or surrounding inflammatory changes. Spleen: Normal in  size without focal abnormality. Adrenals/Urinary Tract: Adrenal glands are unremarkable. Kidneys are normal, without renal calculi, focal lesion, or hydronephrosis. Bladder is unremarkable. Stomach/Bowel: Stomach is within normal limits. Appendix appears normal. No evidence of bowel wall thickening, distention, or inflammatory changes. Vascular/Lymphatic: Aortic atherosclerosis. No enlarged abdominal or pelvic lymph nodes. Reproductive: A uterine mass measuring 3.8 cm likely represents a fibroid. No adnexal mass. Other: No abdominal wall hernia or abnormality. No abdominopelvic ascites. Musculoskeletal: Degenerative changes are seen in the spine. Review of the MIP images confirms the above findings. IMPRESSION: 1. No evidence of pulmonary embolism. 2. No acute findings in the chest, abdomen or pelvis. 3. Hepatic steatosis. 4. Uterine mass likely represents a fibroid. Aortic Atherosclerosis (ICD10-I70.0). Electronically Signed   By: Romona Curls M.D.   On: 09/08/2022 09:27     Discharge Instructions: Discharge Instructions  Call MD for:  extreme fatigue   Complete by: As directed    Call MD for:  persistant dizziness or light-headedness   Complete by: As directed    Call MD for:  persistant nausea and vomiting   Complete by: As directed    Call MD for:  severe uncontrolled pain   Complete by: As directed    Call MD for:  temperature >100.4   Complete by: As directed    Diet - low sodium heart healthy   Complete by: As directed    Discharge instructions   Complete by: As directed    Robin Tucker,  It was a pleasure taking care of you at [facility]. You were admitted for epigastric pain and treated for suspected choledocholithiasis (infected gall bladder stone). We are discharging you home now that you are doing better. Please follow the following instructions.   1) Please continue to take the antibiotic Augmentin until it is finished on the night of 8/24. You are taking it every 12  hours. It is very important not to end this antibiotic treatment early, as this can sometimes give a chance for the infection to come back even if you are feeling better now.   2) Please follow-up with the Internal Medicine clinic with Annett Fabian, MD on September 5, 8:45 AM. During this visit, we will follow up on your diabetes, insulin, nausea, stress, and abdominal pain issues. While you were hospitalized you were hospitalized with diabetes If you have concerning symptoms between now and then, please present to the ER or call 911.    3) During this hospital visit, you were diagnosed with Type 2 Diabetes Mellitus. I have gone ahead and attached numerous sheets of information on this.  --check your blood sugars once in the morning before breakfast and then before you eat --You are taking two types of insulin:  ---14 units of insulin GLARGINE/LANTUS every night ----4 units of insulin LISPRO three times a day with meals. Please check your blood sugars using the blood glucose lancet/test strips three times a day before meals.   If your blood sugars are <100 before a meal, do *not* take the 4 units of insulin with that meal. If your blood sugars are regularly higher than 400, this is an indication to reach out for medical attention.   Pay attention to symptoms of low blood sugar, also known as hypoglycemia. This can include: dizziness, dizziness on standing, feeling fatigued, feeling weak. You should carry a sugary food (candy bar, etc.) with you at all times if you are leaving the home. If you feel these things, reach a stable sitting position and eat this food until you feel well enough to walk.   Symptoms of hyperglycemia include: increased thirst, increased urination, headache and fatigue. If untreated, hyperglycemia can lead to condition called diabetic ketoacidosis or DKA, a condition where acid forms in the blood in the presence of too much sugar and can present with severe  nausea/vomiting/fatigue/rapid breathing. This is a medical emergency and will need hospital treatment.   4) If you are feeling down, depressed, or overwhelmed with stress, we have outpatient psychiatric resources that can help with therapy and, if desired, with supplemental medication management. Please discuss this at your follow-up visit. Our mental health providers would be happy to assist you. If you feel that you are so down that your life or the lives of others are at risk, call 911 or present to the nearest emergency department.  5) You were also diagnosed with hypertension or high blood pressure in the hospital. Please take the medication Losartan 25mg  once a day for this diagnosis. You will follow up with this in the outpatient clinic on 9/5.   6) Be sure to increase your activity slowly including getting up from a laying/seated position. It is normal for someone who has been in the hospital for some time to become weaker over that time. Please continue to use a front-wheel walker for mobility.  Please take care,   Luiz Iron, MD   Increase activity slowly   Complete by: As directed       Robin Tucker,  It was a pleasure taking care of you at [facility]. You were admitted for epigastric pain and treated for suspected choledocholithiasis (infected gall bladder stone). We are discharging you home now that you are doing better. Please follow the following instructions.   1) Please continue to take the antibiotic Augmentin until it is finished on the night of 8/24. You are taking it every 12 hours. It is very important not to end this antibiotic treatment early, as this can sometimes give a chance for the infection to come back even if you are feeling better now.   2) Please follow-up with the Internal Medicine clinic with Annett Fabian, MD on September 5, 8:45 AM. During this visit, we will follow up on your diabetes, insulin, nausea, stress, and abdominal pain issues. While  you were hospitalized you were hospitalized with diabetes If you have concerning symptoms between now and then, please present to the ER or call 911.    3) During this hospital visit, you were diagnosed with Type 2 Diabetes Mellitus. I have gone ahead and attached numerous sheets of information on this.  --check your blood sugars once in the morning before breakfast and then before you eat --You are taking two types of insulin:  ---14 units of insulin GLARGINE/LANTUS every night ----4 units of insulin LISPRO three times a day with meals. Please check your blood sugars using the blood glucose lancet/test strips three times a day before meals.   If your blood sugars are <100 before a meal, do *not* take the 4 units of insulin with that meal. If your blood sugars are regularly higher than 400, this is an indication to reach out for medical attention.   Pay attention to symptoms of low blood sugar, also known as hypoglycemia. This can include: dizziness, dizziness on standing, feeling fatigued, feeling weak. You should carry a sugary food (candy bar, etc.) with you at all times if you are leaving the home. If you feel these things, reach a stable sitting position and eat this food until you feel well enough to walk.   Symptoms of hyperglycemia include: increased thirst, increased urination, headache and fatigue. If untreated, hyperglycemia can lead to condition called diabetic ketoacidosis or DKA, a condition where acid forms in the blood in the presence of too much sugar and can present with severe nausea/vomiting/fatigue/rapid breathing. This is a medical emergency and will need hospital treatment.   4) If you are feeling down, depressed, or overwhelmed with stress, we have outpatient psychiatric resources that can help with therapy and, if desired, with supplemental medication management. Please discuss this at your follow-up visit. Our mental health providers would be happy to assist you. If you feel  that you are so down that your life or the lives of others are at risk, call 911 or present to the  nearest emergency department.  5) You were also diagnosed with hypertension or high blood pressure in the hospital. Please take the medication Losartan 25mg  once a day for this diagnosis. You will follow up with this in the outpatient clinic on 9/5.   6) Be sure to increase your activity slowly including getting up from a laying/seated position. It is normal for someone who has been in the hospital for some time to become weaker over that time. Please continue to use a front-wheel walker for mobility.  Please take care,   Luiz Iron, MD  Signed: Tomie China, MD 09/13/2022, 12:37 PM   Pager: [phone]

## 2022-09-13 NOTE — Progress Notes (Signed)
Mobility Specialist: Progress Note   09/13/22 1205  Mobility  Activity Ambulated with assistance in hallway  Level of Assistance Standby assist, set-up cues, supervision of patient - no hands on  Assistive Device Front wheel walker  Distance Ambulated (ft) 92 ft  Activity Response Tolerated well  Mobility Referral Yes  $Mobility charge 1 Mobility  Mobility Specialist Start Time (ACUTE ONLY) 1150  Mobility Specialist Stop Time (ACUTE ONLY) 1201  Mobility Specialist Time Calculation (min) (ACUTE ONLY) 11 min    Pt was agreeable to mobility session - received on EOB. Had c/o L foot discomfort from "fat pad" near toes, and overall body discomfort and mild aching body pain. Also c/o slight dizziness and feeling a little nauseous towards EOS. Returned to bed without fault. Left on EOB with all needs met, call bell in reach. Family in room.   Maurene Capes Mobility Specialist Please contact via SecureChat or Rehab office at [phone]

## 2022-09-13 NOTE — Discharge Instructions (Addendum)
Robin Tucker,  It was a pleasure taking care of you at [facility]. You were admitted for epigastric pain and treated for suspected choledocholithiasis (infected gall bladder stone). We are discharging you home now that you are doing better. Please follow the following instructions.   1) Please continue to take the antibiotic Augmentin until it is finished on the night of 8/24. You are taking it every 12 hours. It is very important not to end this antibiotic treatment early, as this can sometimes give a chance for the infection to come back even if you are feeling better now.   2) Please follow-up with the Internal Medicine clinic with Annett Fabian, MD on September 5, 8:45 AM. During this visit, we will follow up on your diabetes, insulin, nausea, stress, and abdominal pain issues. While you were hospitalized you were hospitalized with diabetes If you have concerning symptoms between now and then, please present to the ER or call 911.    3) During this hospital visit, you were diagnosed with Type 2 Diabetes Mellitus. I have gone ahead and attached numerous sheets of information on this.  --check your blood sugars once in the morning before breakfast and then before you eat --You are taking two types of insulin:  ---14 units of insulin GLARGINE/LANTUS every night ----4 units of insulin LISPRO three times a day with meals. Please check your blood sugars using the blood glucose lancet/test strips three times a day before meals.   If your blood sugars are <100 before a meal, do *not* take the 4 units of insulin with that meal. If your blood sugars are regularly higher than 400, this is an indication to reach out for medical attention.   Pay attention to symptoms of low blood sugar, also known as hypoglycemia. This can include: dizziness, dizziness on standing, feeling fatigued, feeling weak. You should carry a sugary food (candy bar, etc.) with you at all times if you are leaving the home. If  you feel these things, reach a stable sitting position and eat this food until you feel well enough to walk.   Symptoms of hyperglycemia include: increased thirst, increased urination, headache and fatigue. If untreated, hyperglycemia can lead to condition called diabetic ketoacidosis or DKA, a condition where acid forms in the blood in the presence of too much sugar and can present with severe nausea/vomiting/fatigue/rapid breathing. This is a medical emergency and will need hospital treatment.   4) If you are feeling down, depressed, or overwhelmed with stress, we have outpatient psychiatric resources that can help with therapy and, if desired, with supplemental medication management. Please discuss this at your follow-up visit. Our mental health providers would be happy to assist you. If you feel that you are so down that your life or the lives of others are at risk, call 911 or present to the nearest emergency department.  5) You were also diagnosed with hypertension or high blood pressure in the hospital. Please take the medication Losartan 25mg  once a day for this diagnosis. You will follow up with this in the outpatient clinic on 9/5.   6) Be sure to increase your activity slowly including getting up from a laying/seated position. It is normal for someone who has been in the hospital for some time to become weaker over that time. Please continue to use a front-wheel walker for mobility.  Please take care,   Luiz Iron, MD

## 2022-09-13 NOTE — Progress Notes (Signed)
Pt discharged home with her daughter after going over discharge education. Home meds provided by TOC, pre discharge.

## 2022-09-27 ENCOUNTER — Encounter: Payer: Medicare Other | Admitting: Student

## 2022-10-01 ENCOUNTER — Encounter: Payer: Self-pay | Admitting: Student

## 2022-10-01 ENCOUNTER — Ambulatory Visit (INDEPENDENT_AMBULATORY_CARE_PROVIDER_SITE_OTHER): Payer: Medicare Other | Admitting: Student

## 2022-10-01 VITALS — BP 140/71 | HR 87 | Ht 64.0 in | Wt 208.1 lb

## 2022-10-01 DIAGNOSIS — I10 Essential (primary) hypertension: Secondary | ICD-10-CM

## 2022-10-01 DIAGNOSIS — R42 Dizziness and giddiness: Secondary | ICD-10-CM | POA: Diagnosis not present

## 2022-10-01 DIAGNOSIS — R1013 Epigastric pain: Secondary | ICD-10-CM

## 2022-10-01 DIAGNOSIS — E1169 Type 2 diabetes mellitus with other specified complication: Secondary | ICD-10-CM | POA: Diagnosis not present

## 2022-10-01 MED ORDER — BLOOD GLUCOSE TEST VI STRP
1.0000 | ORAL_STRIP | Freq: Three times a day (TID) | 2 refills | Status: DC
Start: 2022-10-01 — End: 2022-11-29

## 2022-10-01 MED ORDER — INSULIN GLARGINE 100 UNIT/ML SOLOSTAR PEN: 18.0000 [IU] | PEN_INJECTOR | Freq: Every day | SUBCUTANEOUS | 3 refills | Status: AC

## 2022-10-01 MED ORDER — INSULIN LISPRO (1 UNIT DIAL) 100 UNIT/ML (KWIKPEN): 4.0000 [IU] | PEN_INJECTOR | Freq: Three times a day (TID) | SUBCUTANEOUS | 3 refills | Status: AC

## 2022-10-01 MED ORDER — ACCU-CHEK SOFTCLIX LANCETS MISC
1.0000 | Freq: Three times a day (TID) | 2 refills | Status: AC
Start: 2022-10-01 — End: 2022-10-31

## 2022-10-01 MED ORDER — INSULIN PEN NEEDLE 32G X 4 MM MISC: 1.0000 | Freq: Four times a day (QID) | 0 refills | Status: DC

## 2022-10-01 NOTE — Patient Instructions (Addendum)
Thank you so much for coming to the clinic today!   For your diabetes, we have made the following adjustments:  For your night time medication, we have increased this to 18 units of Lantus  For your meal time medication, please continue to take the 4 units of Novolog with the following adjustments  For every 60 points above your glucose goal of 120, add 1 unit. For example, if your glucose is 180, take 5 units. If your glucose is 240, take 6 units.   If you have any questions please feel free to the call the clinic at anytime at [phone]. It was a pleasure seeing you!  Best, Dr. Rayvon Char    Insulin sliding scale as follows: on a TID schedule, subtract 120 from glucose value, and administer 1 additional unit of Novolog insulin for each 60 points of glucose above your target.    Ex: If blood sugar is 140 no additional unites are necessary.  If sugar is 180 give 1 additional unit.  If 240 give 2 additional units.

## 2022-10-01 NOTE — Progress Notes (Unsigned)
CC: Hospital follow-up  HPI: [name] is a 72 y.o. female living with a history stated below and presents today for hospital follow-up. Please see problem based assessment and plan for additional details.  Past Medical History:  Diagnosis Date   Anxiety    Chicken pox 1954   Colon polyps    Depression    Endometriosis    Vertigo     Current Outpatient Medications on File Prior to Visit  Medication Sig Dispense Refill   Blood Glucose Monitoring Suppl (BLOOD GLUCOSE MONITOR SYSTEM) w/Device KIT Use in the morning, at noon, and at bedtime. 1 kit 0   Lancet Device MISC 1 each by Does not apply route in the morning, at noon, and at bedtime. May substitute to any manufacturer covered by patient's insurance. 1 each 0   losartan (COZAAR) 25 MG tablet Take 1 tablet (25 mg total) by mouth daily. 30 tablet 0   prochlorperazine (COMPAZINE) 5 MG tablet Take 1 tablet (5 mg total) by mouth every 8 (eight) hours as needed for up to 10 doses for nausea or vomiting. 10 tablet 0   No current facility-administered medications on file prior to visit.    Family History  Problem Relation Age of Onset   Heart disease Mother    Colon cancer Mother    Arthritis Mother    Depression Mother    Heart attack Mother    Hyperlipidemia Mother    Hypertension Mother    Mental illness Mother    Miscarriages / [place] Mother    Stroke Mother    Colon polyps Mother    Leukemia Father    Hypertension Father    Hyperlipidemia Father    Depression Sister    Hypertension Sister    Stroke Sister    Cancer Maternal Grandfather    Stroke Paternal Grandmother    Heart attack Paternal Grandfather    Esophageal cancer Neg Hx    Rectal cancer Neg Hx    Stomach cancer Neg Hx     Social History   Socioeconomic History   Marital status: Single    Spouse name: Not on file   Number of children: Not on file   Years of education: college   Highest education level: Not on file  Occupational  History   Occupation: retired  Tobacco Use   Smoking status: Never   Smokeless tobacco: Never  Vaping Use   Vaping status: Never Used  Substance and Sexual Activity   Alcohol use: No    Alcohol/week: 0.0 standard drinks of alcohol    Comment: occasional   Drug use: No   Sexual activity: Not Currently  Other Topics Concern   Not on file  Social History Narrative   Has 4 children    One child lives with her.    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  Vitals:   10/01/22 1436 10/01/22 1542  BP: (!) 145/59 (!) 140/71  Pulse: (!) 101 87  SpO2: 99%   Weight: 208 lb 1.6 oz (94.4 kg)   Height: 5\' 4"  (  1.626 m)     Physical Exam: Constitutional: well-appearing in no acute distress HENT: normocephalic atraumatic, mucous membranes moist Eyes: conjunctiva non-erythematous Neck: supple Cardiovascular: regular rate and rhythm, no m/r/g Pulmonary/Chest: normal work of breathing on room air, lungs clear to auscultation bilaterally Abdominal: soft, non-tender, non-distended MSK: normal bulk and tone Neurological: alert & oriented x 3, 5/5 strength in bilateral upper and lower extremities, normal gait Skin: warm and dry  Assessment & Plan:   Epigastric pain Patient noted to have elevated LFTs, hyperbilirubinemia, and hepatic steatosis in the hospital.  She confirmed that she finished her Augmentin for 2 days after discharge.  Her epigastric pain has resolved and she denies any current signs or symptoms.  Vertigo Over hospital stay, patient had inconclusive vertical testing as she was acutely nauseous at the time of testing.   This was diagnosed in 2016 and has been slowly improving since then and per the patient has resolved.  It does tend to worsen with head turning but she denies any current nausea or vomiting.  Patient is not interested in vertical therapy at this time.  Type 2 diabetes mellitus [facility]) Patient had a new diagnosis her hospital stay.  Her A1c was 11.7% at this time.  She was started on 14 units of glargine at bedtime and 4 units of lispro.  She spoke with Lupita Leash regarding diabetes education.  We increased her Lantus to 18 units at bedtime and 4 units of NovoLog before meals.  We also added a sliding scale of 1 extra unit for every 60 points above her goal of 130. - Continue 18 units of Lantus at nighttime - Continue 4 units of NovoLog at meals with a sliding scale at mealtime.  For every 60 points above her goal, add 1 extra unit.  Hypertension Blood pressure today was 145/59.  At discharge, it peaked at 173/75.  She was prescribed losartan 25 mg.  She noted missing a couple doses but has no headache, vision changes, or other signs or symptoms.  We discussed increasing her losartan to 50 mg but will hold off at this time.  If blood pressure remains consistently high, increase to 50 mg of losartan - Continue 25 mg losartan   Patient seen with Dr. Collier Flowers, MD  [facility] Internal Medicine, PGY-1 Date 10/02/2022 Time 12:19 PM

## 2022-10-02 DIAGNOSIS — I10 Essential (primary) hypertension: Secondary | ICD-10-CM | POA: Insufficient documentation

## 2022-10-02 NOTE — Assessment & Plan Note (Signed)
Over hospital stay, patient had inconclusive vertical testing as she was acutely nauseous at the time of testing.  This was diagnosed in 2016 and has been slowly improving since then and per the patient has resolved.  It does tend to worsen with head turning but she denies any current nausea or vomiting.  Patient is not interested in vertical therapy at this time.

## 2022-10-02 NOTE — Assessment & Plan Note (Signed)
Blood pressure today was 145/59.  At discharge, it peaked at 173/75.  She was prescribed losartan 25 mg.  She noted missing a couple doses but has no headache, vision changes, or other signs or symptoms.  We discussed increasing her losartan to 50 mg but will hold off at this time.  If blood pressure remains consistently high, increase to 50 mg of losartan - Continue 25 mg losartan

## 2022-10-02 NOTE — Assessment & Plan Note (Signed)
Patient noted to have elevated LFTs, hyperbilirubinemia, and hepatic steatosis in the hospital.  She confirmed that she finished her Augmentin for 2 days after discharge.  Her epigastric pain has resolved and she denies any current signs or symptoms.

## 2022-10-02 NOTE — Progress Notes (Signed)
Internal Medicine Clinic Attending  I was physically present during the key portions of the resident provided service and participated in the medical decision making of patient's management care. I reviewed pertinent patient test results.  The assessment, diagnosis, and plan were formulated together and I agree with the documentation in the resident's note.  Added cautious sliding scale and discussed with her basal bolus insulin therapy, she has struggled some with this concept given she likes to eat small meals.  I suggested that as she recovers from her acute illness she may need less insulin and be able to come of the mealtime insulin, if she cannot I would suggest considering an omnipod. Gust Rung, DO

## 2022-10-02 NOTE — Assessment & Plan Note (Signed)
Patient had a new diagnosis her hospital stay.  Her A1c was 11.7% at this time.  She was started on 14 units of glargine at bedtime and 4 units of lispro.  She spoke with Lupita Leash regarding diabetes education.  We increased her Lantus to 18 units at bedtime and 4 units of NovoLog before meals.  We also added a sliding scale of 1 extra unit for every 60 points above her goal of 130. - Continue 18 units of Lantus at nighttime - Continue 4 units of NovoLog at meals with a sliding scale at mealtime.  For every 60 points above her goal, add 1 extra unit.

## 2022-10-16 ENCOUNTER — Ambulatory Visit: Payer: Medicare Other

## 2022-10-16 ENCOUNTER — Ambulatory Visit: Payer: Medicare Other | Admitting: Student

## 2022-10-16 VITALS — BP 126/60 | HR 90 | Temp 97.8°F | Ht 64.0 in | Wt 210.3 lb

## 2022-10-16 VITALS — BP 126/60 | HR 90 | Temp 97.8°F | Wt 210.3 lb

## 2022-10-16 DIAGNOSIS — E1169 Type 2 diabetes mellitus with other specified complication: Secondary | ICD-10-CM

## 2022-10-16 DIAGNOSIS — I10 Essential (primary) hypertension: Secondary | ICD-10-CM

## 2022-10-16 DIAGNOSIS — E669 Obesity, unspecified: Secondary | ICD-10-CM | POA: Insufficient documentation

## 2022-10-16 DIAGNOSIS — E119 Type 2 diabetes mellitus without complications: Secondary | ICD-10-CM | POA: Diagnosis not present

## 2022-10-16 DIAGNOSIS — R1013 Epigastric pain: Secondary | ICD-10-CM

## 2022-10-16 DIAGNOSIS — Z6836 Body mass index (BMI) 36.0-36.9, adult: Secondary | ICD-10-CM

## 2022-10-16 DIAGNOSIS — R748 Abnormal levels of other serum enzymes: Secondary | ICD-10-CM

## 2022-10-16 DIAGNOSIS — Z0000 Encounter for general adult medical examination without abnormal findings: Secondary | ICD-10-CM

## 2022-10-16 DIAGNOSIS — Z794 Long term (current) use of insulin: Secondary | ICD-10-CM

## 2022-10-16 MED ORDER — LOSARTAN POTASSIUM 25 MG PO TABS
25.0000 mg | ORAL_TABLET | Freq: Every day | ORAL | 11 refills | Status: AC
Start: 2022-10-16 — End: ?

## 2022-10-16 NOTE — Assessment & Plan Note (Signed)
Resolved since hospitalization. We will check her LFT's today to ensure resolution.

## 2022-10-16 NOTE — Assessment & Plan Note (Signed)
Blood pressure well-controlled, 126/60 at today's visit.  Currently on losartan 25 mg.  We will continue this regimen.  Patient has goal to eventually get off all medications, discussed that diet and weight loss is her best option for this.

## 2022-10-16 NOTE — Patient Instructions (Addendum)
Thank you, [name] for allowing [place] to provide your care today. Today we discussed Diabetes management.    I have ordered the following labs for you:   Lab Orders         Microalbumin / Creatinine Urine Ratio         CMP14 + Anion Gap         Hemoglobin A1c      Referrals ordered today:    Referral Orders         Ambulatory referral to Ophthalmology       Please do some research on your own regarding Ozempic / Mounjaro and Metformin. We can discuss this at subsequent     Follow up: 2 months    We look forward to seeing you next time. Please call our clinic at [phone] if you have any questions or concerns. The best time to call is Monday-Friday from 9am-4pm, but there is someone available 24/7. If after hours or the weekend, call the main hospital number and ask for the Internal Medicine Resident On-Call. If you need medication refills, please notify your pharmacy one week in advance and they will send [place] a request.   Thank you for trusting me with your care. Wishing you the best!  Lovie Macadamia MD [facility]

## 2022-10-16 NOTE — Progress Notes (Unsigned)
Subjective:   Robin Tucker is a 72 y.o. female who presents for an Initial Medicare Annual Wellness Visit.  Visit Complete: In person  Patient Medicare AWV questionnaire was completed by the patient on 10/16/2022; I have confirmed that all information answered by patient is correct and no changes since this date.        Objective:    Today's Vitals   10/16/22 1511  BP: 126/60  Pulse: 90  Temp: 97.8 F (36.6 C)  TempSrc: Oral  SpO2: 99%  Weight: 210 lb 5.1 oz (95.4 kg)   Body mass index is 36.1 kg/m.     10/16/2022    4:20 PM 10/16/2022    3:16 PM 10/01/2022    2:39 PM 09/08/2022    7:33 AM 11/30/2014    1:09 PM 11/16/2014    2:13 PM 01/02/2014    1:47 PM  Advanced Directives  Does Patient Have a Medical Advance Directive? No No No No No No No  Would patient like information on creating a medical advance directive? No - Patient declined No - Patient declined No - Patient declined    No - patient declined information    Current Medications (verified) Outpatient Encounter Medications as of 10/16/2022  Medication Sig   Accu-Chek Softclix Lancets lancets Use 1 each in the morning, at noon, and at bedtime.   Blood Glucose Monitoring Suppl (BLOOD GLUCOSE MONITOR SYSTEM) w/Device KIT Use in the morning, at noon, and at bedtime.   Glucose Blood (BLOOD GLUCOSE TEST STRIPS) STRP USe in the morning, at noon, and at bedtime. May substitute to any manufacturer covered by patient's insurance.   insulin glargine (LANTUS) 100 UNIT/ML Solostar Pen Inject 18 Units into the skin daily.   insulin lispro (HUMALOG) 100 UNIT/ML KwikPen Inject 4-10 Units into the skin 3 (three) times daily. Inject 4 units three times a day before meals. May add additional units per correction factor of 60.   Insulin Pen Needle 32G X 4 MM MISC Use in the morning, at noon, in the evening, and at bedtime. Change your pen needles with each use   [DISCONTINUED] losartan (COZAAR) 25 MG tablet Take 1 tablet (25 mg  total) by mouth daily.   [DISCONTINUED] prochlorperazine (COMPAZINE) 5 MG tablet Take 1 tablet (5 mg total) by mouth every 8 (eight) hours as needed for up to 10 doses for nausea or vomiting.   No facility-administered encounter medications on file as of 10/16/2022.    Allergies (verified) Codeine   History: Past Medical History:  Diagnosis Date   Anxiety    Chicken pox 1954   Colon polyps    Depression    Endometriosis    Vertigo    Past Surgical History:  Procedure Laterality Date   ABLATION ON ENDOMETRIOSIS     CESAREAN SECTION     per pt 4   COLONOSCOPY  2016   POLYPECTOMY     Family History  Problem Relation Age of Onset   Heart disease Mother    Colon cancer Mother    Arthritis Mother    Depression Mother    Heart attack Mother    Hyperlipidemia Mother    Hypertension Mother    Mental illness Mother    Miscarriages / [place] Mother    Stroke Mother    Colon polyps Mother    Leukemia Father    Hypertension Father    Hyperlipidemia Father    Depression Sister    Hypertension Sister    Stroke  Sister    Cancer Maternal Grandfather    Stroke Paternal Grandmother    Heart attack Paternal Grandfather    Esophageal cancer Neg Hx    Rectal cancer Neg Hx    Stomach cancer Neg Hx    Social History   Socioeconomic History   Marital status: Single    Spouse name: Not on file   Number of children: Not on file   Years of education: college   Highest education level: Not on file  Occupational History   Occupation: retired  Tobacco Use   Smoking status: Never   Smokeless tobacco: Never  Vaping Use   Vaping status: Never Used  Substance and Sexual Activity   Alcohol use: No    Alcohol/week: 0.0 standard drinks of alcohol    Comment: occasional   Drug use: No   Sexual activity: Not Currently  Other Topics Concern   Not on file  Social History Narrative   Has 4 children    One child lives with her.    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    Social Determinants of Health   Financial Resource Strain: High Risk (10/17/2022)   Overall Financial Resource Strain (CARDIA)    Difficulty of Paying Living Expenses: Very hard  Food Insecurity: Food Insecurity Present (10/17/2022)   Hunger Vital Sign    Worried About Running Out of Food in the Last Year: Sometimes true    Ran Out of Food in the Last Year: Never true  Transportation Needs: No Transportation Needs (10/17/2022)   PRAPARE - Administrator, Civil Service (Medical): No    Lack of Transportation (Non-Medical): No  Physical Activity: Insufficiently Active (10/17/2022)   Exercise Vital Sign    Days of Exercise per Week: 2 days    Minutes of Exercise per Session: 20 min  Stress: No Stress Concern Present (10/17/2022)   [employer] of Occupational Health - Occupational Stress Questionnaire    Feeling of Stress : Only a little  Social Connections: Moderately Integrated (10/17/2022)   Social Connection and Isolation Panel [NHANES]    Frequency of Communication with Friends and Family: More than three times a week    Frequency of Social Gatherings with Friends and Family: Once a week    Attends Religious Services: 1 to 4 times per year    Active Member of [employer] or Organizations: Yes    Attends Engineer, structural: More than 4 times per year    Marital Status: Divorced    Tobacco Counseling Counseling given: Not Answered   Clinical Intake:  Pre-visit preparation completed: Yes  Pain : 0-10     BMI - recorded: 36.1 Nutritional Status: BMI > 30  Obese Nutritional Risks: None Diabetes: Yes CBG done?: Yes CBG resulted in Enter/ Edit results?: Yes Did pt. bring in CBG monitor from home?: No  How often do you need to have someone help you when you read instructions, pamphlets, or other written materials from your doctor or pharmacy?: 1 - Never What is the last grade level you completed in school?: college  Interpreter Needed?:  No  Information entered by :: [email or web address]   Activities of Daily Living    10/16/2022    3:13 PM 10/01/2022    2:39 PM  In your present state of health, do you have any difficulty performing the following activities:  Hearing? 0 0  Vision? 0 0  Comment need new glasses   Difficulty concentrating or  making decisions? 0 0  Walking or climbing stairs? 0 0  Dressing or bathing? 0 0  Doing errands, shopping? 0 0    Patient Care Team: Morrie Sheldon, MD as PCP - General (Internal Medicine)  Indicate any recent Medical Services you may have received from other than [facility] providers in the past year (date may be approximate).     Assessment:   This is a routine wellness examination for Robin Tucker.  Hearing/Vision screen No results found.   Goals Addressed   None   Depression Screen    10/17/2022    8:38 AM 10/01/2022    4:23 PM 09/10/2014    2:38 PM  PHQ 2/9 Scores  PHQ - 2 Score 1 0 3  PHQ- 9 Score 3 2 13     Fall Risk    10/16/2022    3:13 PM 10/01/2022    2:39 PM 09/10/2014    2:38 PM  Fall Risk   Falls in the past year? 0 0 Yes  Number falls in past yr: 0 0 1  Injury with Fall? 0 0 No  Risk for fall due to : No Fall Risks No Fall Risks Impaired balance/gait  Follow up Falls evaluation completed Falls evaluation completed Falls evaluation completed;Falls prevention discussed    MEDICARE RISK AT HOME: Medicare Risk at Home Any stairs in or around the home?: Yes If so, are there any without handrails?: No Home free of loose throw rugs in walkways, pet beds, electrical cords, etc?: Yes Adequate lighting in your home to reduce risk of falls?: Yes Life alert?: No Use of a cane, walker or w/c?: No Grab bars in the bathroom?: No Shower chair or bench in shower?: No Elevated toilet seat or a handicapped toilet?: No  TIMED UP AND GO:  Was the test performed? No    Cognitive Function:        10/17/2022    8:39 AM  6CIT Screen  What Year? 0 points  What month? 0 points   What time? 0 points  Count back from 20 0 points  Months in reverse 0 points  Repeat phrase 0 points  Total Score 0 points    Immunizations Immunization History  Administered Date(s) Administered   PPD Test 09/23/2015   Tdap 09/13/2014    TDAP status: Up to date  Flu Vaccine status: Declined, Education has been provided regarding the importance of this vaccine but patient still declined. Advised may receive this vaccine at local pharmacy or Health Dept. Aware to provide a copy of the vaccination record if obtained from local pharmacy or Health Dept. Verbalized acceptance and understanding.  Pneumococcal vaccine status: Declined,  Education has been provided regarding the importance of this vaccine but patient still declined. Advised may receive this vaccine at local pharmacy or Health Dept. Aware to provide a copy of the vaccination record if obtained from local pharmacy or Health Dept. Verbalized acceptance and understanding.   Covid-19 vaccine status: Declined, Education has been provided regarding the importance of this vaccine but patient still declined. Advised may receive this vaccine at local pharmacy or Health Dept.or vaccine clinic. Aware to provide a copy of the vaccination record if obtained from local pharmacy or Health Dept. Verbalized acceptance and understanding.  Qualifies for Shingles Vaccine? Yes   Zostavax completed No   Shingrix Completed?: No.    Education has been provided regarding the importance of this vaccine. Patient has been advised to call insurance company to determine out of pocket expense if  they have not yet received this vaccine. Advised may also receive vaccine at local pharmacy or Health Dept. Verbalized acceptance and understanding.  Screening Tests Health Maintenance  Topic Date Due   OPHTHALMOLOGY EXAM  Never done   Diabetic kidney evaluation - Urine ACR  Never done   MAMMOGRAM  Never done   Zoster Vaccines- Shingrix (1 of 2) Never done   DEXA  SCAN  Never done   COVID-19 Vaccine (1 - 2023-24 season) Never done   INFLUENZA VACCINE  04/22/2023 (Originally 08/23/2022)   Pneumonia Vaccine 97+ Years old (1 of 1 - PCV) 10/16/2023 (Originally 01/08/2017)   HEMOGLOBIN A1C  04/15/2023   Diabetic kidney evaluation - eGFR measurement  10/16/2023   FOOT EXAM  10/16/2023   Medicare Annual Wellness (AWV)  10/16/2023   Colonoscopy  05/17/2024   DTaP/Tdap/Td (2 - Td or Tdap) 09/12/2024   Hepatitis C Screening  Completed   HPV VACCINES  Aged Out    Health Maintenance  Health Maintenance Due  Topic Date Due   OPHTHALMOLOGY EXAM  Never done   Diabetic kidney evaluation - Urine ACR  Never done   MAMMOGRAM  Never done   Zoster Vaccines- Shingrix (1 of 2) Never done   DEXA SCAN  Never done   COVID-19 Vaccine (1 - 2023-24 season) Never done    Colorectal cancer screening: Type of screening: Colonoscopy. Completed 05/18/2019. Repeat every 5 years  Mammogram status: Overdue-pt declines at this time  Bone Density status: overdue  Lung Cancer Screening: (Low Dose CT Chest recommended if Age 21-80 years, 20 pack-year currently smoking OR have quit w/in 15years.) does not qualify.   Lung Cancer Screening Referral: n/a  Additional Screening:  Hepatitis C Screening: does not qualify; Completed 09/09/2022  Vision Screening: Recommended annual ophthalmology exams for early detection of glaucoma and other disorders of the eye. Is the patient up to date with their annual eye exam?  No  Who is the provider or what is the name of the office in which the patient attends annual eye exams? Dr Lorin Picket with [facility] If pt is not established with a provider, would they like to be referred to a provider to establish care? No .   Dental Screening: Recommended annual dental exams for proper oral hygiene  Diabetic Foot Exam: 10/16/2023  Community Resource Referral / Chronic Care Management: CRR required this visit?  No   CCM required this visit?   No     Plan:     I have personally reviewed and noted the following in the patient's chart:   Medical and social history Use of alcohol, tobacco or illicit drugs  Current medications and supplements including opioid prescriptions. Patient is not currently taking opioid prescriptions. Functional ability and status Nutritional status Physical activity Advanced directives List of other physicians Hospitalizations, surgeries, and ER visits in previous 12 months Vitals Screenings to include cognitive, depression, and falls Referrals and appointments  In addition, I have reviewed and discussed with patient certain preventive protocols, quality metrics, and best practice recommendations. A written personalized care plan for preventive services as well as general preventive health recommendations were provided to patient.     Joslyn Devon, [place]   10/17/2022   After Visit Summary: (In Person-Printed) AVS printed and mailed to patient  Nurse Notes: face to face  Robin Tucker , Thank you for taking time to come for your Medicare Wellness Visit. I appreciate your ongoing commitment to your health goals. Please review the following  plan we discussed and let me know if I can assist you in the future.   These are the goals we discussed:  Goals   None     This is a list of the screening recommended for you and due dates:  Health Maintenance  Topic Date Due   Eye exam for diabetics  Never done   Yearly kidney health urinalysis for diabetes  Never done   Mammogram  Never done   Zoster (Shingles) Vaccine (1 of 2) Never done   DEXA scan (bone density measurement)  Never done   COVID-19 Vaccine (1 - 2023-24 season) Never done   Flu Shot  04/22/2023*   Pneumonia Vaccine (1 of 1 - PCV) 10/16/2023*   Hemoglobin A1C  04/15/2023   Yearly kidney function blood test for diabetes  10/16/2023   Complete foot exam   10/16/2023   Medicare Annual Wellness Visit  10/16/2023   Colon  Cancer Screening  05/17/2024   DTaP/Tdap/Td vaccine (2 - Td or Tdap) 09/12/2024   Hepatitis C Screening  Completed   HPV Vaccine  Aged Out  *Topic was postponed. The date shown is not the original due date.

## 2022-10-16 NOTE — Assessment & Plan Note (Addendum)
Patient is currently on 18 units glargine, 4 units of lispro plus sliding scale adjustment.  She has met with Lupita Leash in the past.  She states that her sugars are all over the place usually in the 150-250 range.  She is having some difficulty affording her medications, in particular she went to refill her insulin lispro and was told that her prescription will cost over $170.  We have reached out to our clinical pharmacist in order to see if there are any options to get her her medication for cheaper. She states she cannot currently afford this co-pay and will not be filling her mealtime insulin.  We were able to provide the patient with 4 Insulin Novolog pens. 1:1 conversion from her Humalog. This was discussed with the patient.   The patient does not like pharmaceuticals in general, and would like to avoid all medications if possible. We had extensive discussion with this patient regarding metformin and a GLP such as Ozempic.  She is not currently interested in adding any new medications.  We discussed that metformin Ozempic would likely be helpful in trying to get her off insulin in the long run.  We discussed lifestyle modification weight loss with this patient.  Ozempic would likely be a good option for her to aid in weight loss, lower her A1c, and provide additional cardiovascular benefit.  She will look into Ozempic and metformin on her own and decide at subsequent visits if it has something she is interested in beginning.  Patient also requesting repeat A1c today.  We discussed that this is not necessarily recommended as only been about 1 month since her last A1c, however the patient feels that would be very motivating to see her A1c drop.   Plan: - A1c today - Discussed medication recommendations at length patient not currently interested. -Discussed meeting with Lupita Leash again patient not currently interested -Diabetic foot exam today -Optho referral for diabetic eye exam -Urine microalbumin to  creatinine ratio today - I have reached out to pharmacy to see what our options are for getting her insulin for cheaper. - Follow-up 2 months - Recommend to bring glucose monitor next visit - Samples given Novolog 4 pens.

## 2022-10-16 NOTE — Patient Instructions (Signed)
Diabetes Mellitus and Foot Care Diabetes, also called diabetes mellitus, may cause problems with your feet and legs because of poor blood flow (circulation). Poor circulation may make your skin: Become thinner and drier. Break more easily. Heal more slowly. Peel and crack. You may also have nerve damage (neuropathy). This can cause decreased feeling in your legs and feet. This means that you may not notice minor injuries to your feet that could lead to more serious problems. Finding and treating problems early is the best way to prevent future foot problems. How to care for your feet Foot hygiene  Wash your feet daily with warm water and mild soap. Do not use hot water. Then, pat your feet and the areas between your toes until they are fully dry. Do not soak your feet. This can dry your skin. Trim your toenails straight across. Do not dig under them or around the cuticle. File the edges of your nails with an emery board or nail file. Apply a moisturizing lotion or petroleum jelly to the skin on your feet and to dry, brittle toenails. Use lotion that does not contain alcohol and is unscented. Do not apply lotion between your toes. Shoes and socks Wear clean socks or stockings every day. Make sure they are not too tight. Do not wear knee-high stockings. These may decrease blood flow to your legs. Wear shoes that fit well and have enough cushioning. Always look in your shoes before you put them on to be sure there are no objects inside. To break in new shoes, wear them for just a few hours a day. This prevents injuries on your feet. Wounds, scrapes, corns, and calluses  Check your feet daily for blisters, cuts, bruises, sores, and redness. If you cannot see the bottom of your feet, use a mirror or ask someone for help. Do not cut off corns or calluses or try to remove them with medicine. If you find a minor scrape, cut, or break in the skin on your feet, keep it and the skin around it clean and  dry. You may clean these areas with mild soap and water. Do not clean the area with peroxide, alcohol, or iodine. If you have a wound, scrape, corn, or callus on your foot, look at it several times a day to make sure it is healing and not infected. Check for: Redness, swelling, or pain. Fluid or blood. Warmth. Pus or a bad smell. General tips Do not cross your legs. This may decrease blood flow to your feet. Do not use heating pads or hot water bottles on your feet. They may burn your skin. If you have lost feeling in your feet or legs, you may not know this is happening until it is too late. Protect your feet from hot and cold by wearing shoes, such as at the beach or on hot pavement. Schedule a complete foot exam at least once a year or more often if you have foot problems. Report any cuts, sores, or bruises to your health care provider right away. Where to find more information American Diabetes Association: diabetes.org Association of Diabetes Care & Education Specialists: diabeteseducator.org Contact a health care provider if: You have a condition that increases your risk of infection, and you have any cuts, sores, or bruises on your feet. You have an injury that is not healing. You have redness on your legs or feet. You feel burning or tingling in your legs or feet. You have pain or cramps in your legs  and feet. Your legs or feet are numb. Your feet always feel cold. You have pain around any toenails. Get help right away if: You have a wound, scrape, corn, or callus on your foot and: You have signs of infection. You have a fever. You have a red line going up your leg. This information is not intended to replace advice given to you by your health care provider. Make sure you discuss any questions you have with your health care provider. Document Revised: 07/12/2021 Document Reviewed: 07/12/2021 Elsevier Patient Education  2024 [employer] Inc.  Health Maintenance, Female Adopting  a healthy lifestyle and getting preventive care are important in promoting health and wellness. Ask your health care provider about: The right schedule for you to have regular tests and exams. Things you can do on your own to prevent diseases and keep yourself healthy. What should I know about diet, weight, and exercise? Eat a healthy diet  Eat a diet that includes plenty of vegetables, fruits, low-fat dairy products, and lean protein. Do not eat a lot of foods that are high in solid fats, added sugars, or sodium. Maintain a healthy weight Body mass index (BMI) is used to identify weight problems. It estimates body fat based on height and weight. Your health care provider can help determine your BMI and help you achieve or maintain a healthy weight. Get regular exercise Get regular exercise. This is one of the most important things you can do for your health. Most adults should: Exercise for at least 150 minutes each week. The exercise should increase your heart rate and make you sweat (moderate-intensity exercise). Do strengthening exercises at least twice a week. This is in addition to the moderate-intensity exercise. Spend less time sitting. Even light physical activity can be beneficial. Watch cholesterol and blood lipids Have your blood tested for lipids and cholesterol at 71 years of age, then have this test every 5 years. Have your cholesterol levels checked more often if: Your lipid or cholesterol levels are high. You are older than 71 years of age. You are at high risk for heart disease. What should I know about cancer screening? Depending on your health history and family history, you may need to have cancer screening at various ages. This may include screening for: Breast cancer. Cervical cancer. Colorectal cancer. Skin cancer. Lung cancer. What should I know about heart disease, diabetes, and high blood pressure? Blood pressure and heart disease High blood pressure causes  heart disease and increases the risk of stroke. This is more likely to develop in people who have high blood pressure readings or are overweight. Have your blood pressure checked: Every 3-5 years if you are 26-53 years of age. Every year if you are 32 years old or older. Diabetes Have regular diabetes screenings. This checks your fasting blood sugar level. Have the screening done: Once every three years after age 67 if you are at a normal weight and have a low risk for diabetes. More often and at a younger age if you are overweight or have a high risk for diabetes. What should I know about preventing infection? Hepatitis B If you have a higher risk for hepatitis B, you should be screened for this virus. Talk with your health care provider to find out if you are at risk for hepatitis B infection. Hepatitis C Testing is recommended for: Everyone born from [birth year] through 1964. Anyone with known risk factors for hepatitis C. Sexually transmitted infections (STIs) Get screened for STIs, including  gonorrhea and chlamydia, if: You are sexually active and are younger than 71 years of age. You are older than 71 years of age and your health care provider tells you that you are at risk for this type of infection. Your sexual activity has changed since you were last screened, and you are at increased risk for chlamydia or gonorrhea. Ask your health care provider if you are at risk. Ask your health care provider about whether you are at high risk for HIV. Your health care provider may recommend a prescription medicine to help prevent HIV infection. If you choose to take medicine to prevent HIV, you should first get tested for HIV. You should then be tested every 3 months for as long as you are taking the medicine. Pregnancy If you are about to stop having your period (premenopausal) and you may become pregnant, seek counseling before you get pregnant. Take 400 to 800 micrograms (mcg) of folic acid every day  if you become pregnant. Ask for birth control (contraception) if you want to prevent pregnancy. Osteoporosis and menopause Osteoporosis is a disease in which the bones lose minerals and strength with aging. This can result in bone fractures. If you are 35 years old or older, or if you are at risk for osteoporosis and fractures, ask your health care provider if you should: Be screened for bone loss. Take a calcium or vitamin D supplement to lower your risk of fractures. Be given hormone replacement therapy (HRT) to treat symptoms of menopause. Follow these instructions at home: Alcohol use Do not drink alcohol if: Your health care provider tells you not to drink. You are pregnant, may be pregnant, or are planning to become pregnant. If you drink alcohol: Limit how much you have to: 0-1 drink a day. Know how much alcohol is in your drink. In the [place] one drink equals one 12 oz bottle of beer (355 mL), one 5 oz glass of wine (148 mL), or one 1 oz glass of hard liquor (44 mL). Lifestyle Do not use any products that contain nicotine or tobacco. These products include cigarettes, chewing tobacco, and vaping devices, such as e-cigarettes. If you need help quitting, ask your health care provider. Do not use street drugs. Do not share needles. Ask your health care provider for help if you need support or information about quitting drugs. General instructions Schedule regular health, dental, and eye exams. Stay current with your vaccines. Tell your health care provider if: You often feel depressed. You have ever been abused or do not feel safe at home. Summary Adopting a healthy lifestyle and getting preventive care are important in promoting health and wellness. Follow your health care provider's instructions about healthy diet, exercising, and getting tested or screened for diseases. Follow your health care provider's instructions on monitoring your cholesterol and blood pressure. This  information is not intended to replace advice given to you by your health care provider. Make sure you discuss any questions you have with your health care provider. Document Revised: 05/30/2020 Document Reviewed: 05/30/2020 Elsevier Patient Education  2024 [employer].

## 2022-10-16 NOTE — Progress Notes (Signed)
Subjective:  CC: Diabetes follow up  HPI:  [name] is a 72 y.o. female with a past medical history stated below and presents today for diabetes follow up. Please see problem based assessment and plan for additional details.  Past Medical History:  Diagnosis Date   Anxiety    Chicken pox 1954   Colon polyps    Depression    Endometriosis    Vertigo     Current Outpatient Medications on File Prior to Visit  Medication Sig Dispense Refill   Accu-Chek Softclix Lancets lancets Use 1 each in the morning, at noon, and at bedtime. 100 each 2   Blood Glucose Monitoring Suppl (BLOOD GLUCOSE MONITOR SYSTEM) w/Device KIT Use in the morning, at noon, and at bedtime. 1 kit 0   Glucose Blood (BLOOD GLUCOSE TEST STRIPS) STRP USe in the morning, at noon, and at bedtime. May substitute to any manufacturer covered by patient's insurance. 100 each 2   insulin glargine (LANTUS) 100 UNIT/ML Solostar Pen Inject 18 Units into the skin daily. 15 mL 3   insulin lispro (HUMALOG) 100 UNIT/ML KwikPen Inject 4-10 Units into the skin 3 (three) times daily. Inject 4 units three times a day before meals. May add additional units per correction factor of 60. 15 mL 3   Insulin Pen Needle 32G X 4 MM MISC Use in the morning, at noon, in the evening, and at bedtime. Change your pen needles with each use 200 each 0   losartan (COZAAR) 25 MG tablet Take 1 tablet (25 mg total) by mouth daily. 30 tablet 0   prochlorperazine (COMPAZINE) 5 MG tablet Take 1 tablet (5 mg total) by mouth every 8 (eight) hours as needed for up to 10 doses for nausea or vomiting. 10 tablet 0   No current facility-administered medications on file prior to visit.    Review of Systems: ROS negative except for what is noted on the assessment and plan.  Objective:   Vitals:   10/16/22 1511  BP: 126/60  Pulse: 90  Temp: 97.8 F (36.6 C)  TempSrc: Oral  SpO2: 99%  Weight: 210 lb 4.8 oz (95.4 kg)  Height: 5\' 4"  (1.626 m)     Physical Exam: Constitutional: well-appearing in no acute distress Cardiovascular: regular rate and rhythm, no m/r/g Pulmonary/Chest: normal work of breathing on room air, lungs clear to auscultation bilaterally Abdominal: soft, non-tender, non-distended Extremities: Palpable dorsalis pedis and posterior tibialis artery pulses bilaterally. Skin: warm and dry Psych: normal mood and affect     Assessment & Plan:  Morbid obesity (HCC) Patient with BMI>35 associated with significant co-morbidity of HTN and DM. Would be a good candidate for Ozempic given DM2. Weight stable from last visit. Per patient she does not eat much and sticks to mostly meats and vegetables. She does endorsing snacking.   Hypertension Blood pressure well-controlled, 126/60 at today's visit.  Currently on losartan 25 mg.  We will continue this regimen.  Patient has goal to eventually get off all medications, discussed that diet and weight loss is her best option for this.  Type 2 diabetes mellitus (HCC) Patient is currently on 18 units glargine, 4 units of lispro plus sliding scale adjustment.  She has met with Lupita Leash in the past.  She states that her sugars are all over the place usually in the 150-250 range.  She is having some difficulty affording her medications, in particular she went to refill her insulin lispro and was told that her  prescription will cost over $170.  We have reached out to our clinical pharmacist in order to see if there are any options to get her her medication for cheaper. She states she cannot currently afford this co-pay and will not be filling her mealtime insulin.  We were able to provide the patient with 4 Insulin Novolog pens. 1:1 conversion from her Humalog. This was discussed with the patient.   The patient does not like pharmaceuticals in general, and would like to avoid all medications if possible. We had extensive discussion with this patient regarding metformin and a GLP such as  Ozempic.  She is not currently interested in adding any new medications.  We discussed that metformin Ozempic would likely be helpful in trying to get her off insulin in the long run.  We discussed lifestyle modification weight loss with this patient.  Ozempic would likely be a good option for her to aid in weight loss, lower her A1c, and provide additional cardiovascular benefit.  She will look into Ozempic and metformin on her own and decide at subsequent visits if it has something she is interested in beginning.  Patient also requesting repeat A1c today.  We discussed that this is not necessarily recommended as only been about 1 month since her last A1c, however the patient feels that would be very motivating to see her A1c drop.   Plan: - A1c today - Discussed medication recommendations at length patient not currently interested. -Discussed meeting with Lupita Leash again patient not currently interested -Diabetic foot exam today -Optho referral for diabetic eye exam -Urine microalbumin to creatinine ratio today - I have reached out to pharmacy to see what our options are for getting her insulin for cheaper. - Follow-up 2 months - Recommend to bring glucose monitor next visit - Samples given Novolog 4 pens.    Epigastric pain Resolved since hospitalization. We will check her LFT's today to ensure resolution.    Patient seen with Dr. Heide Scales MD [facility]  PGY-1 Pager: [phone]  Phone: [phone] Date 10/16/2022  Time 4:44 PM

## 2022-10-16 NOTE — Assessment & Plan Note (Addendum)
Patient with BMI>35 associated with significant co-morbidity of HTN and DM. Would be a good candidate for Ozempic given DM2. Weight stable from last visit. Per patient she does not eat much and sticks to mostly meats and vegetables. She does endorsing snacking.

## 2022-10-17 ENCOUNTER — Other Ambulatory Visit (HOSPITAL_COMMUNITY): Payer: Self-pay

## 2022-10-17 LAB — CMP14 + ANION GAP
ALT: 41 IU/L — ABNORMAL HIGH (ref 0–32)
AST: 23 IU/L (ref 0–40)
Albumin: 4.2 g/dL (ref 3.9–4.9)
Alkaline Phosphatase: 92 IU/L (ref 44–121)
Anion Gap: 14 mmol/L (ref 10.0–18.0)
BUN/Creatinine Ratio: 19 (ref 12–28)
BUN: 13 mg/dL (ref 8–27)
Bilirubin Total: 0.8 mg/dL (ref 0.0–1.2)
CO2: 21 mmol/L (ref 20–29)
Calcium: 10.2 mg/dL (ref 8.7–10.3)
Chloride: 101 mmol/L (ref 96–106)
Creatinine, Ser: 0.7 mg/dL (ref 0.57–1.00)
Globulin, Total: 2.4 g/dL (ref 1.5–4.5)
Glucose: 248 mg/dL — ABNORMAL HIGH (ref 70–99)
Potassium: 4.2 mmol/L (ref 3.5–5.2)
Sodium: 136 mmol/L (ref 134–144)
Total Protein: 6.6 g/dL (ref 6.0–8.5)
eGFR: 93 mL/min/{1.73_m2} (ref >59)

## 2022-10-17 LAB — MICROALBUMIN / CREATININE URINE RATIO
Creatinine, Urine: 107.6 mg/dL
Microalb/Creat Ratio: 53 mg/g creat — ABNORMAL HIGH (ref 0–29)
Microalbumin, Urine: 56.9 ug/mL

## 2022-10-17 LAB — HEMOGLOBIN A1C
Est. average glucose Bld gHb Est-mCnc: 240 mg/dL
Hgb A1c MFr Bld: 10 % — ABNORMAL HIGH (ref 4.8–5.6)

## 2022-10-17 NOTE — Addendum Note (Signed)
Addended by: Lovie Macadamia on: 10/17/2022 09:26 AM   Modules accepted: Level of Service

## 2022-10-29 ENCOUNTER — Telehealth: Payer: Self-pay

## 2022-10-29 NOTE — Telephone Encounter (Signed)
Patient called she is requesting financial assistance for her insulin, patient stated the best way to reach her is through my chart since her phone screens phone numbers, more than likely she wont get the call.

## 2022-10-30 LAB — HM DIABETES EYE EXAM

## 2022-10-31 NOTE — Progress Notes (Signed)
Internal Medicine Clinic Attending  I was physically present during the key portions of the resident provided service and participated in the medical decision making of patient's management care. I reviewed pertinent patient test results.  The assessment, diagnosis, and plan were formulated together and I agree with the documentation in the resident's note.  Narendra, Nischal, MD  

## 2022-10-31 NOTE — Progress Notes (Signed)
Internal Medicine Clinic Attending  Case and documentation of Dr.  Rosaura Carpenter   reviewed.  I reviewed the AWV findings.  I agree with the assessment, diagnosis, and plan of care documented in the AWV note.

## 2022-11-01 ENCOUNTER — Other Ambulatory Visit (HOSPITAL_COMMUNITY): Payer: Self-pay

## 2022-11-01 NOTE — Telephone Encounter (Signed)
Pharmacy Patient Advocate Encounter   Received notification from Pt Calls Messages that prior authorization for HUMALOG is required/requested.   Per test claim: The current 50 day co-pay is, $70.  No PA needed at this time. This test claim was processed through [facility]- copay amounts may vary at other pharmacies due to pharmacy/plan contracts, or as the patient moves through the different stages of their insurance plan.   Should be $35 for 30 day supply.  Called pharmacy regarding last Lantus Solostar copay (unable to run test claim, pt refill too soon).  Last fill 10/19/22 was $35 for 28 day supply.  Left patient message for pt requesting call back regarding assistance options.

## 2022-11-01 NOTE — Telephone Encounter (Signed)
Pt interested in patient assistance for insulins.   Would switching from Lantus to Basaglar be an option? This way patient could receive assistance for both medications through [employer] patient assistance?  She will fill current medications at pharmacy for now while we work on assistance.

## 2022-11-02 ENCOUNTER — Other Ambulatory Visit (HOSPITAL_COMMUNITY): Payer: Self-pay

## 2022-11-02 MED ORDER — BASAGLAR KWIKPEN 100 UNIT/ML ~~LOC~~ SOPN
18.0000 [IU] | PEN_INJECTOR | Freq: Every day | SUBCUTANEOUS | 2 refills | Status: AC
  Filled 2022-11-02 – 2022-11-09 (×2): qty 15, 83d supply, fill #0

## 2022-11-02 NOTE — Telephone Encounter (Addendum)
Will switch patient from Lantus to Basaglar so we can get her on a patient assistance program.

## 2022-11-02 NOTE — Addendum Note (Signed)
Addended by: Lovie Macadamia on: 11/02/2022 03:11 PM   Modules accepted: Orders

## 2022-11-09 ENCOUNTER — Encounter: Payer: Self-pay | Admitting: Student

## 2022-11-09 ENCOUNTER — Other Ambulatory Visit: Payer: Self-pay | Admitting: Student

## 2022-11-09 ENCOUNTER — Other Ambulatory Visit (HOSPITAL_COMMUNITY): Payer: Self-pay

## 2022-11-12 ENCOUNTER — Other Ambulatory Visit (HOSPITAL_COMMUNITY): Payer: Self-pay

## 2022-11-12 ENCOUNTER — Telehealth: Payer: Self-pay

## 2022-11-12 NOTE — Telephone Encounter (Signed)
Patient called regarding the financial assistance application form she was supposed to receive, she is requesting to speak to you. Please return her call [phone]

## 2022-11-12 NOTE — Telephone Encounter (Signed)
Responded to patient via mychart

## 2022-11-28 ENCOUNTER — Telehealth: Payer: Self-pay

## 2022-11-28 ENCOUNTER — Encounter: Payer: Self-pay | Admitting: Dietician

## 2022-11-28 NOTE — Telephone Encounter (Signed)
Rec'd completed [employer] application from patient for assistance with medications Humalog and Basaglar.   Provider pages in Dr. Dewayne Shorter box for signature.

## 2022-11-29 ENCOUNTER — Encounter: Payer: Self-pay | Admitting: Student

## 2022-11-29 DIAGNOSIS — E1169 Type 2 diabetes mellitus with other specified complication: Secondary | ICD-10-CM

## 2022-12-05 MED ORDER — BLOOD GLUCOSE TEST VI STRP: 1.0000 | ORAL_STRIP | Freq: Three times a day (TID) | 2 refills | Status: AC

## 2022-12-05 MED ORDER — INSULIN PEN NEEDLE 32G X 4 MM MISC: 1.0000 | Freq: Four times a day (QID) | 0 refills | Status: AC

## 2022-12-05 NOTE — Progress Notes (Signed)
Pharmacy Medication Assistance Program Note    12/17/2022  Patient ID: CHAVON DUSH, female   DOB: 09-Nov-1950, 72 y.o.   MRN: [id number]     12/05/2022 12/17/2022  Outreach Medication One  Initial Outreach Date (Medication One) 10/29/2022   Manufacturer Medication One [facility] Humalog   Type of Assistance Manufacturer Assistance   Date Application Sent to Prescriber 11/28/2022   Name of Prescriber Carlynn Purl   Date Application Received From Patient 11/27/2022   Date Application Received From Provider 12/05/2022   Date Application Submitted to Manufacturer 12/05/2022   Method Application Sent to Manufacturer Fax   Patient Assistance Determination  Approved  Approval Start Date  12/06/2022  Approval End Date  01/22/2024           12/17/2022  Patient ID: Thedore Mins, female  DOB: 02/23/1950, 72 y.o.  MRN:  [id number]     12/05/2022 12/17/2022  Outreach Medication Two  Initial Outreach Date (Medication Two) 10/29/2022   Manufacturer Medication Two [facility] Basaglar   Type of Radiographer, therapeutic Assistance   Date Application Sent to Prescriber 11/28/2022   Name of Prescriber Carlynn Purl   Date Application Received From Provider 12/05/2022   Method Application Sent to Manufacturer Fax   Date Application Submitted to Manufacturer 12/05/2022   Patient Assistance Determination  Approved  Approval Start Date  12/06/2022

## 2022-12-25 ENCOUNTER — Encounter: Payer: Self-pay | Admitting: Student

## 2022-12-25 ENCOUNTER — Ambulatory Visit (INDEPENDENT_AMBULATORY_CARE_PROVIDER_SITE_OTHER): Payer: Medicare Other | Admitting: Student

## 2022-12-25 VITALS — BP 137/71 | HR 87 | Temp 98.0°F | Ht 64.0 in | Wt 210.9 lb

## 2022-12-25 DIAGNOSIS — Z0000 Encounter for general adult medical examination without abnormal findings: Secondary | ICD-10-CM | POA: Insufficient documentation

## 2022-12-25 DIAGNOSIS — Z6836 Body mass index (BMI) 36.0-36.9, adult: Secondary | ICD-10-CM

## 2022-12-25 DIAGNOSIS — E119 Type 2 diabetes mellitus without complications: Secondary | ICD-10-CM | POA: Diagnosis not present

## 2022-12-25 DIAGNOSIS — Z794 Long term (current) use of insulin: Secondary | ICD-10-CM | POA: Diagnosis not present

## 2022-12-25 DIAGNOSIS — E1169 Type 2 diabetes mellitus with other specified complication: Secondary | ICD-10-CM

## 2022-12-25 DIAGNOSIS — I10 Essential (primary) hypertension: Secondary | ICD-10-CM

## 2022-12-25 LAB — POCT GLYCOSYLATED HEMOGLOBIN (HGB A1C): Hemoglobin A1C: 8.5 % — AB (ref 4.0–5.6)

## 2022-12-25 LAB — GLUCOSE, CAPILLARY: Glucose-Capillary: 218 mg/dL — ABNORMAL HIGH (ref 70–99)

## 2022-12-25 MED ORDER — OZEMPIC (0.25 OR 0.5 MG/DOSE) 2 MG/3ML ~~LOC~~ SOPN: 0.2500 mg | PEN_INJECTOR | SUBCUTANEOUS | 0 refills | Status: AC

## 2022-12-25 MED ORDER — SEMAGLUTIDE (1 MG/DOSE) 4 MG/3ML ~~LOC~~ SOPN: 1.0000 mg | PEN_INJECTOR | SUBCUTANEOUS | 0 refills | Status: AC

## 2022-12-25 MED ORDER — SEMAGLUTIDE(0.25 OR 0.5MG/DOS) 2 MG/3ML ~~LOC~~ SOPN: 0.5000 mg | PEN_INJECTOR | SUBCUTANEOUS | 0 refills | Status: AC

## 2022-12-25 MED ORDER — SEMAGLUTIDE (2 MG/DOSE) 8 MG/3ML ~~LOC~~ SOPN: 2.0000 mg | PEN_INJECTOR | SUBCUTANEOUS | 4 refills | Status: AC

## 2022-12-25 NOTE — Assessment & Plan Note (Addendum)
A1c today is 8.5, improved compared to 10 on 9/24 but still above goal of 7. Currently prescribed 18 u long-acting (Lantus or basaglar depending on which is cheaper) and short-acting listpro 4 units plus sliding scale, typically 5 units at mealtimes. She reports that her morning blood sugars are usually in the 160s. She is not reporting symptoms of hyper- or hypoglycemia. She is frustrated that insulin is so expensive and she expresses difficulty adjusting to her current medication regimen. She is currently prioritizing glycemic control due to immediate impacts of readmission to the hospital, but is resistant to additional medications that can provide improved long term benefits.   She has made significant dietary modifications and is frustrated that her weight has not changed. She is not interested in metformin at this time, although may be interested in the future for glycemic control and cost. She is amenable to starting Ozempic for diabetic and weight management as discussed last visit, especially in the context of reducing and eventually getting her off insulin in the long run. She has a history of nausea during her hospitalization, which she attributes to the antibiotics and which has since resolved since completing those courses. We have counseled her to contact the clinic if she has any GI side effects and we will extend her Ozempic taper as tolerated.   The Ozempic and losartan are protective for her proteinuria of 53 noted last visit. If A1c is still uncontrolled at next visit, consider additional SGLT2 inhibitor. Repeat urine collected today.   Given her ASCVD risk score of 21.9%, we strongly recommended a high-intensity statin for lowering her lipids. She firmly refused a statin and was provided with resources about Zetia to consider before next visit.   - Continue long acting insulin 18 units - Continue short acting insulin 4 units + sliding scale - Start Ozempic 0.25 mg weekly - Follow up  repeat urine microalbumin/creatinine ratio - Next foot exam 09/2023 - Next eye exam 10/2023 - Consider metformin for further A1c control - Consider SGLT2i for further A1c control

## 2022-12-25 NOTE — Assessment & Plan Note (Addendum)
Blood pressure today is 137/71, well-controlled with losartan 25 mg. She admits to sometimes forgetting to take her losartan. No changes to this at this time. As she was started on this medication in September, we will check her potassium today. - Continue losartan 25 mg daily - Follow up BMP today

## 2022-12-25 NOTE — Assessment & Plan Note (Signed)
Discussed lifestyle modifications.

## 2022-12-25 NOTE — Progress Notes (Addendum)
This is a Psychologist, occupational Note.  The care of the patient was discussed with Dr. Sol Blazing and the assessment and plan was formulated with their assistance.  Please see their note for official documentation of the patient encounter.   Subjective:   Patient ID: Robin Tucker female   DOB: [birth year] 72 y.o.   MRN: [id number]  HPI: Robin Tucker is a 72 y.o. female presenting for hypertension and diabetes management.  Past Medical History:  Diagnosis Date   Anxiety    Chicken pox 1954   Colon polyps    Depression    Endometriosis    Vertigo    Current Outpatient Medications  Medication Sig Dispense Refill   [START ON 02/26/2023] Semaglutide, 1 MG/DOSE, 4 MG/3ML SOPN Inject 1 mg into the skin once a week. Dispense after patient finishes 0.5 mg weekly dosing is done 3 mL 0   [START ON 03/26/2023] Semaglutide, 2 MG/DOSE, 8 MG/3ML SOPN Inject 2 mg into the skin once a week for 28 days. 3 mL 4   Semaglutide,0.25 or 0.5MG /DOS, (OZEMPIC, 0.25 OR 0.5 MG/DOSE,) 2 MG/3ML SOPN Inject 0.25 mg into the skin once a week for 28 days. 3 mL 0   [START ON 01/29/2023] Semaglutide,0.25 or 0.5MG /DOS, 2 MG/3ML SOPN Inject 0.5 mg into the skin once a week for 28 days. 3 mL 0   Blood Glucose Monitoring Suppl (BLOOD GLUCOSE MONITOR SYSTEM) w/Device KIT Use in the morning, at noon, and at bedtime. 1 kit 0   Glucose Blood (BLOOD GLUCOSE TEST STRIPS) STRP USe in the morning, at noon, and at bedtime. May substitute to any manufacturer covered by patient's insurance. 100 each 2   Insulin Glargine (BASAGLAR KWIKPEN) 100 UNIT/ML Inject 18 Units into the skin daily. 15 mL 2   insulin glargine (LANTUS) 100 UNIT/ML Solostar Pen Inject 18 Units into the skin daily. 15 mL 3   insulin lispro (HUMALOG) 100 UNIT/ML KwikPen Inject 4-10 Units into the skin 3 (three) times daily. Inject 4 units three times a day before meals. May add additional units per correction factor of 60. 15 mL 3   Insulin Pen Needle 32G X 4 MM MISC Use in  the morning, at noon, in the evening, and at bedtime. Change your pen needles with each use 200 each 0   losartan (COZAAR) 25 MG tablet Take 1 tablet (25 mg total) by mouth daily. 30 tablet 11   No current facility-administered medications for this visit.   Family History  Problem Relation Age of Onset   Heart disease Mother    Colon cancer Mother    Arthritis Mother    Depression Mother    Heart attack Mother    Hyperlipidemia Mother    Hypertension Mother    Mental illness Mother    Miscarriages / [place] Mother    Stroke Mother    Colon polyps Mother    Leukemia Father    Hypertension Father    Hyperlipidemia Father    Depression Sister    Hypertension Sister    Stroke Sister    Cancer Maternal Grandfather    Stroke Paternal Grandmother    Heart attack Paternal Grandfather    Esophageal cancer Neg Hx    Rectal cancer Neg Hx    Stomach cancer Neg Hx    Social History   Socioeconomic History   Marital status: Single    Spouse name: Not on file   Number of children: Not on file   Years of  education: college   Highest education level: Not on file  Occupational History   Occupation: retired  Tobacco Use   Smoking status: Never   Smokeless tobacco: Never  Vaping Use   Vaping status: Never Used  Substance and Sexual Activity   Alcohol use: No    Alcohol/week: 0.0 standard drinks of alcohol    Comment: occasional   Drug use: No   Sexual activity: Not Currently  Other Topics Concern   Not on file  Social History Narrative   Has 4 children    One child lives with her.    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   Social Determinants of Health   Financial Resource Strain: High Risk (10/17/2022)   Overall Financial Resource Strain (CARDIA)    Difficulty of Paying Living Expenses: Very hard  Food Insecurity: Food Insecurity Present (10/17/2022)   Hunger Vital Sign    Worried About Running Out of Food in the Last Year: Sometimes true    Ran Out of Food  in the Last Year: Never true  Transportation Needs: No Transportation Needs (10/17/2022)   PRAPARE - Administrator, Civil Service (Medical): No    Lack of Transportation (Non-Medical): No  Physical Activity: Insufficiently Active (10/17/2022)   Exercise Vital Sign    Days of Exercise per Week: 2 days    Minutes of Exercise per Session: 20 min  Stress: No Stress Concern Present (10/17/2022)   [employer] of Occupational Health - Occupational Stress Questionnaire    Feeling of Stress : Only a little  Social Connections: Moderately Integrated (10/17/2022)   Social Connection and Isolation Panel [NHANES]    Frequency of Communication with Friends and Family: More than three times a week    Frequency of Social Gatherings with Friends and Family: Once a week    Attends Religious Services: 1 to 4 times per year    Active Member of [employer] or Organizations: Yes    Attends Engineer, structural: More than 4 times per year    Marital Status: Divorced   Review of Systems: Pertinent items noted in HPI and remainder of comprehensive ROS otherwise negative.  Objective:  Physical Exam: Vitals:   12/25/22 1511  BP: 137/71  Pulse: 87  Temp: 98 F (36.7 C)  TempSrc: Oral  SpO2: 98%  Weight: 210 lb 14.4 oz (95.7 kg)  Height: 5\' 4"  (1.626 m)   BP 137/71 (BP Location: Right Arm, Patient Position: Sitting, Cuff Size: Large)   Pulse 87   Temp 98 F (36.7 C) (Oral)   Ht 5\' 4"  (1.626 m)   Wt 210 lb 14.4 oz (95.7 kg)   SpO2 98%   BMI 36.20 kg/m   General Appearance:    Alert, cooperative, no distress, appears stated age  Head:    Normocephalic, atraumatic  Eyes:    PERRL, conjunctiva/corneas clear  Neck:   Supple, symmetrical, trachea midline  Lungs:     Clear to auscultation bilaterally, no increased work of breathing on room air   Heart:    Regular rate and rhythm, S1 and S2 normal, no murmur, rubs or gallop  Abdomen:     Soft, non tender, not distended  Extremities:    Extremities normal, atraumatic, no cyanosis or edema  Pulses:   2+ and symmetric all extremities  Skin:   Skin color, texture, turgor normal, no rashes or lesions  Neurologic:   Alert and oriented, normal gait  Psych:  Normal mood and affect    Assessment & Plan:  Hypertension Blood pressure today is 137/71, well-controlled with losartan 25 mg. She admits to sometimes forgetting to take her losartan. No changes to this at this time. As she was started on this medication in September, we will check her potassium today. - Continue losartan 25 mg daily - Follow up BMP today  Healthcare maintenance Due for DEXA scan and mammogram. Reports that her last mammogram was 20+ years ago and she is not interested in cancer screening.   Type 2 diabetes mellitus (HCC) A1c today is 8.5, improved compared to 10 on 9/24 but still above goal of 7. Currently prescribed 18 u long-acting (Lantus or basaglar depending on which is cheaper) and short-acting listpro 4 units plus sliding scale, typically 5 units at mealtimes. She reports that her morning blood sugars are usually in the 160s. She is not reporting symptoms of hyper- or hypoglycemia. She is frustrated that insulin is so expensive and she expresses difficulty adjusting to her current medication regimen. She is currently prioritizing glycemic control due to immediate impacts of readmission to the hospital, but is resistant to additional medications that can provide improved long term benefits.   She has made significant dietary modifications and is frustrated that her weight has not changed. She is not interested in metformin at this time, although may be interested in the future for glycemic control and cost. She is amenable to starting Ozempic for diabetic and weight management as discussed last visit, especially in the context of reducing and eventually getting her off insulin in the long run. She has a history of nausea during her hospitalization, which  she attributes to the antibiotics and which has since resolved since completing those courses. We have counseled her to contact the clinic if she has any GI side effects and we will extend her Ozempic taper as tolerated.   The Ozempic and losartan are protective for her proteinuria of 53 noted last visit. If A1c is still uncontrolled at next visit, consider additional SGLT2 inhibitor. Repeat urine collected today.   Given her ASCVD risk score of 21.9%, we strongly recommended a high-intensity statin for lowering her lipids. She firmly refused a statin and was provided with resources about Zetia to consider before next visit.   - Continue long acting insulin 18 units - Continue short acting insulin 4 units + sliding scale - Start Ozempic 0.25 mg weekly - Follow up repeat urine microalbumin/creatinine ratio - Next foot exam 09/2023 - Next eye exam 10/2023 - Consider metformin for further A1c control - Consider SGLT2i for further A1c control  Morbid obesity,  BMI>35 associated with significant co-morbidity of HTN and DM Discussed lifestyle modifications   Thea Alken, MS3  Return in about 3 months (around 03/25/2023) for Diabtes follow up .   Attestation for Student Documentation:  I personally was present and re-performed the history, physical exam and medical decision-making activities of this service and have verified that the service and findings are accurately documented in the student's note.  Morene Crocker, MD 12/25/2022, 5:57 PM

## 2022-12-25 NOTE — Patient Instructions (Addendum)
Thank you, Ms. Robin Tucker for allowing [place] to provide your care today.   For your diabetes, please pick up the Ozempic prescription from your pharmacy and follow the instruction below:   You have been started on a medications called Ozempic. You should inject 0.25 mg weekly. Please stay hydrated and eat smaller meals while you are on this medication. Monitor yourself for signs of: -Nausea, vomiting, bloating, diarrhea, or abdominal discomfort.  -Most of the effects should get better as you continue using this medication -If it does not improve in 2-3 days or you have poor oral intake, experience lightheadedness, or feel sick, please STOP the medication and call our office.  - If you have ANY side effects, call our office and let us know, as we may keep you on each dose for longer to let your body adjust to the medication.  If you experience no side effects/tolerate this therapy after 4 weeks (Jan), please increase the dose to 0.5 mg for 4 weeks (Feb). If you are still tolerating the medication, you may increase the dose to 1 mg for 4 weeks (March), then 2 mg (April), which is the preferred dose for weight loss.  Please also look over the materials about Zetia provided.   I have ordered the following labs for you:  Lab Orders         BMP8+Anion Gap         Microalbumin / Creatinine Urine Ratio         Glucose, capillary         POC Hbg A1C      We will call you about your lab results and if there is anything to change as a result of them.  I have ordered the following medication/changed the following medications:   Start the following medications: Meds ordered this encounter  Medications   Semaglutide,0.25 or 0.5MG /DOS, (OZEMPIC, 0.25 OR 0.5 MG/DOSE,) 2 MG/3ML SOPN    Sig: Inject 0.25 mg into the skin once a week for 28 days.    Dispense:  3 mL    Refill:  0   Semaglutide,0.25 or 0.5MG /DOS, 2 MG/3ML SOPN    Sig: Inject 0.5 mg into the skin once a week for 28 days.    Dispense:   3 mL    Refill:  0    Dispense after patient finishes 0.25 mg weekly dosing   Semaglutide, 1 MG/DOSE, 4 MG/3ML SOPN    Sig: Inject 1 mg into the skin once a week. Dispense after patient finishes 0.5 mg weekly dosing is done    Dispense:  3 mL    Refill:  0   Semaglutide, 2 MG/DOSE, 8 MG/3ML SOPN    Sig: Inject 2 mg into the skin once a week for 28 days.    Dispense:  3 mL    Refill:  4    Last tapered dose     Follow up: 3 months (March) for an A1c check or sooner if you have any side effects from the medication  We look forward to seeing you next time. Please call our clinic at [phone] if you have any questions or concerns. The best time to call is Monday-Friday from 9am-4pm, but there is someone available 24/7. If after hours or the weekend, call the main hospital number and ask for the Internal Medicine Resident On-Call. If you need medication refills, please notify your pharmacy one week in advance and they will send [place] a request.   Thank you  for trusting me with your care. Wishing you the best!   Thea Alken, MS3

## 2022-12-25 NOTE — Progress Notes (Deleted)
Diabetes Proteinuria Insulin therapy Hypoglycemic episodes Statin -  The 10-year ASCVD risk score (Arnett DK, et al., 2019) is: 21.9%   Values used to calculate the score:     Age: 72 years     Sex: Female     Is Non-Hispanic African American: No     Diabetic: Yes     Tobacco smoker: No     Systolic Blood Pressure: 126 mmHg     Is BP treated: Yes     HDL Cholesterol: 43 mg/dL     Total Cholesterol: 158 mg/dL [id number]? GI symptoms improved?   HTN BMP today   Hyperlipidemia  Obesity managemen  HM Mammo?

## 2022-12-25 NOTE — Assessment & Plan Note (Signed)
Due for DEXA scan and mammogram. Reports that her last mammogram was 20+ years ago and she is not interested in cancer screening.

## 2022-12-26 LAB — BMP8+ANION GAP
Anion Gap: 15 mmol/L (ref 10.0–18.0)
BUN/Creatinine Ratio: 15 (ref 12–28)
BUN: 12 mg/dL (ref 8–27)
CO2: 23 mmol/L (ref 20–29)
Calcium: 9.9 mg/dL (ref 8.7–10.3)
Chloride: 101 mmol/L (ref 96–106)
Creatinine, Ser: 0.8 mg/dL (ref 0.57–1.00)
Glucose: 229 mg/dL — ABNORMAL HIGH (ref 70–99)
Potassium: 4 mmol/L (ref 3.5–5.2)
Sodium: 139 mmol/L (ref 134–144)
eGFR: 79 mL/min/{1.73_m2} (ref >59)

## 2022-12-26 NOTE — Progress Notes (Signed)
Internal Medicine Clinic Attending  Case discussed with Dr. Gomez-Caraballo  At the time of the visit.  We reviewed the resident's history and exam and pertinent patient test results.  I agree with the assessment, diagnosis, and plan of care documented in the resident's note.  

## 2022-12-27 LAB — MICROALBUMIN / CREATININE URINE RATIO
Creatinine, Urine: 32.3 mg/dL
Microalb/Creat Ratio: 25 mg/g{creat} (ref 0–29)
Microalbumin, Urine: 8 ug/mL

## 2023-01-04 NOTE — Progress Notes (Signed)
Discussed with patient. No changes to current management

## 2023-01-25 ENCOUNTER — Other Ambulatory Visit (HOSPITAL_COMMUNITY): Payer: Self-pay

## 2023-07-03 ENCOUNTER — Encounter: Payer: Self-pay | Admitting: *Deleted

## 2023-11-20 ENCOUNTER — Telehealth: Payer: Self-pay

## 2023-11-20 NOTE — Telephone Encounter (Signed)
 Patient is overdue for an appointment for health maintenance. LMOM for patient to call and schedule. Mychart message sent as well.

## 2023-12-09 NOTE — Telephone Encounter (Signed)
 Patient is on the [facility] list as not completing a PCP visit for 2025. PCP listed is Karna Fellows MD.   LVM for pt to call back as soon as possible.   RE: Schedule appt with Azadegan, Maryam, MD

## 2023-12-30 ENCOUNTER — Other Ambulatory Visit: Payer: Self-pay

## 2023-12-30 DIAGNOSIS — E1169 Type 2 diabetes mellitus with other specified complication: Secondary | ICD-10-CM

## 2024-01-01 NOTE — Telephone Encounter (Signed)
 Patient last seen 12/25/22. I called the patient to schedule a appointment. I was unable to reach the patient. I lvm for her to give us  a call back.

## 2024-01-01 NOTE — Telephone Encounter (Signed)
 Attempt #2 unable to reach the patient.

## 2024-01-06 NOTE — Telephone Encounter (Addendum)
 Lvm for patient to give us  a call back to schedule a appointment.

## 2024-01-22 NOTE — Progress Notes (Addendum)
 Robin Tucker                                          MRN: [id number]   02/20/2024   The [facility] Quality Team Specialist reviewed this patient medical record for the purposes of chart review for care gap closure. The following were reviewed: chart review for care gap closure-kidney health evaluation for diabetes:eGFR  and uACR.    [facility] Quality Team

## 2024-01-24 NOTE — Progress Notes (Signed)
 Robin Tucker                                          MRN: [id number]   01/24/2024   The [facility] Quality Team Specialist reviewed this patient medical record for the purposes of chart review for care gap closure. The following were reviewed: chart review for care gap closure-glycemic status assessment.    [facility] Quality Team
